# Patient Record
Sex: Female | Born: 1980 | Race: Black or African American | Hispanic: No | Marital: Married | Smoking: Current every day smoker
Health system: Southern US, Community
[De-identification: ages and names within clinical notes are randomized; demographics above are authoritative.]

## PROBLEM LIST (undated history)

## (undated) DIAGNOSIS — R87629 Unspecified abnormal cytological findings in specimens from vagina: Secondary | ICD-10-CM

## (undated) DIAGNOSIS — A599 Trichomoniasis, unspecified: Principal | ICD-10-CM

## (undated) HISTORY — PX: TUBAL LIGATION: SHX77

## (undated) HISTORY — DX: Unspecified abnormal cytological findings in specimens from vagina: R87.629

## (undated) HISTORY — DX: Trichomoniasis, unspecified: A59.9

---

## 2012-04-03 ENCOUNTER — Emergency Department (HOSPITAL_COMMUNITY): Payer: Managed Care, Other (non HMO)

## 2012-04-03 ENCOUNTER — Inpatient Hospital Stay (HOSPITAL_COMMUNITY)
Admission: EM | Admit: 2012-04-03 | Discharge: 2012-04-05 | DRG: 417 | Disposition: A | Discharge status: Home / Self Care | Payer: Managed Care, Other (non HMO) | Attending: General Surgery | Admitting: General Surgery

## 2012-04-03 ENCOUNTER — Encounter (HOSPITAL_COMMUNITY): Payer: Self-pay | Admitting: *Deleted

## 2012-04-03 DIAGNOSIS — K859 Acute pancreatitis without necrosis or infection, unspecified: Secondary | ICD-10-CM | POA: Diagnosis present

## 2012-04-03 DIAGNOSIS — Z87891 Personal history of nicotine dependence: Secondary | ICD-10-CM

## 2012-04-03 DIAGNOSIS — Z6838 Body mass index (BMI) 38.0-38.9, adult: Secondary | ICD-10-CM

## 2012-04-03 DIAGNOSIS — E669 Obesity, unspecified: Secondary | ICD-10-CM | POA: Diagnosis present

## 2012-04-03 DIAGNOSIS — K819 Cholecystitis, unspecified: Secondary | ICD-10-CM

## 2012-04-03 DIAGNOSIS — K8010 Calculus of gallbladder with chronic cholecystitis without obstruction: Principal | ICD-10-CM | POA: Diagnosis present

## 2012-04-03 LAB — COMPREHENSIVE METABOLIC PANEL
ALT: 16 U/L (ref 0–35)
AST: 14 U/L (ref 0–37)
Alkaline Phosphatase: 76 U/L (ref 39–117)
CO2: 26 mEq/L (ref 19–32)
Calcium: 9.5 mg/dL (ref 8.4–10.5)
GFR calc Af Amer: >90 mL/min (ref >90)
GFR calc non Af Amer: >90 mL/min (ref >90)
Glucose, Bld: 100 mg/dL — ABNORMAL HIGH (ref 70–99)
Potassium: 3.7 mEq/L (ref 3.5–5.1)
Sodium: 138 mEq/L (ref 135–145)
Total Protein: 6.8 g/dL (ref 6.0–8.3)

## 2012-04-03 LAB — CBC WITH DIFFERENTIAL/PLATELET
Basophils Absolute: 0 10*3/uL (ref 0.0–0.1)
Eosinophils Relative: 3 % (ref 0–5)
Lymphocytes Relative: 36 % (ref 12–46)
Lymphs Abs: 3.4 10*3/uL (ref 0.7–4.0)
Neutrophils Relative %: 54 % (ref 43–77)
Platelets: 313 10*3/uL (ref 150–400)
RBC: 4.34 MIL/uL (ref 3.87–5.11)
RDW: 15.2 % (ref 11.5–15.5)
WBC: 9.6 10*3/uL (ref 4.0–10.5)

## 2012-04-03 LAB — URINALYSIS, ROUTINE W REFLEX MICROSCOPIC
Leukocytes, UA: NEGATIVE
Nitrite: NEGATIVE
Specific Gravity, Urine: >1.03 — ABNORMAL HIGH (ref 1.005–1.030)
Urobilinogen, UA: 0.2 mg/dL (ref 0.0–1.0)
pH: 6 (ref 5.0–8.0)

## 2012-04-03 LAB — PREGNANCY, URINE: Preg Test, Ur: NEGATIVE

## 2012-04-03 MED ORDER — HYDROMORPHONE HCL PF 1 MG/ML IJ SOLN
1.0000 mg | Freq: Once | INTRAMUSCULAR | Status: AC
  Administered 2012-04-03: 1 mg via INTRAVENOUS
  Filled 2012-04-03: qty 1

## 2012-04-03 MED ORDER — ONDANSETRON HCL 4 MG/2ML IJ SOLN
4.0000 mg | Freq: Once | INTRAMUSCULAR | Status: AC
  Administered 2012-04-03: 4 mg via INTRAVENOUS
  Filled 2012-04-03: qty 2

## 2012-04-03 MED ORDER — KCL-LACTATED RINGERS-D5W 20 MEQ/L IV SOLN
INTRAVENOUS | Status: DC
  Administered 2012-04-03 – 2012-04-04 (×2): via INTRAVENOUS
  Filled 2012-04-03 (×3): qty 1000

## 2012-04-03 MED ORDER — IOHEXOL 300 MG/ML  SOLN: 40.0000 mL | Freq: Once | INTRAMUSCULAR | Status: AC | PRN

## 2012-04-03 MED ORDER — ACETAMINOPHEN 650 MG RE SUPP: 650.0000 mg | Freq: Four times a day (QID) | RECTAL | Status: DC | PRN

## 2012-04-03 MED ORDER — CHLORHEXIDINE GLUCONATE 4 % EX LIQD
1.0000 "application " | Freq: Once | CUTANEOUS | Status: AC
  Administered 2012-04-03: 1 via TOPICAL
  Filled 2012-04-03: qty 15

## 2012-04-03 MED ORDER — DIPHENHYDRAMINE HCL 12.5 MG/5ML PO ELIX
12.5000 mg | ORAL_SOLUTION | Freq: Four times a day (QID) | ORAL | Status: DC | PRN
  Filled 2012-04-03: qty 10

## 2012-04-03 MED ORDER — ACETAMINOPHEN 325 MG PO TABS: 650.0000 mg | ORAL_TABLET | Freq: Four times a day (QID) | ORAL | Status: DC | PRN

## 2012-04-03 MED ORDER — PANTOPRAZOLE SODIUM 40 MG PO TBEC: 40.0000 mg | DELAYED_RELEASE_TABLET | Freq: Every day | ORAL | Status: DC

## 2012-04-03 MED ORDER — CEFAZOLIN SODIUM-DEXTROSE 2-3 GM-% IV SOLR
2.0000 g | INTRAVENOUS | Status: AC
  Administered 2012-04-04: 2 g via INTRAVENOUS
  Filled 2012-04-03: qty 50

## 2012-04-03 MED ORDER — ENOXAPARIN SODIUM 40 MG/0.4ML ~~LOC~~ SOLN
40.0000 mg | SUBCUTANEOUS | Status: DC
  Administered 2012-04-03 – 2012-04-04 (×2): 40 mg via SUBCUTANEOUS
  Filled 2012-04-03 (×2): qty 0.4

## 2012-04-03 MED ORDER — HYDROMORPHONE HCL PF 1 MG/ML IJ SOLN
1.0000 mg | INTRAMUSCULAR | Status: DC | PRN
  Administered 2012-04-04 (×2): 1 mg via INTRAVENOUS
  Filled 2012-04-03 (×2): qty 1

## 2012-04-03 MED ORDER — HYDROCODONE-ACETAMINOPHEN 5-325 MG PO TABS
1.0000 | ORAL_TABLET | ORAL | Status: DC | PRN
  Administered 2012-04-04: 1 via ORAL
  Administered 2012-04-04: 2 via ORAL
  Filled 2012-04-03: qty 2
  Filled 2012-04-03: qty 1

## 2012-04-03 MED ORDER — SODIUM CHLORIDE 0.9 % IV SOLN
INTRAVENOUS | Status: DC
  Administered 2012-04-03: 1000 mL via INTRAVENOUS

## 2012-04-03 MED ORDER — KCL-LACTATED RINGERS-D5W 20 MEQ/L IV SOLN
INTRAVENOUS | Status: AC
  Filled 2012-04-03: qty 2000

## 2012-04-03 MED ORDER — SODIUM CHLORIDE 0.9 % IV BOLUS (SEPSIS)
1000.0000 mL | Freq: Once | INTRAVENOUS | Status: AC
Start: 2012-04-03 — End: 2012-04-03
  Administered 2012-04-03: 1000 mL via INTRAVENOUS

## 2012-04-03 MED ORDER — DIPHENHYDRAMINE HCL 50 MG/ML IJ SOLN
12.5000 mg | Freq: Four times a day (QID) | INTRAMUSCULAR | Status: DC | PRN
  Filled 2012-04-03: qty 1

## 2012-04-03 MED ORDER — ONDANSETRON HCL 4 MG/2ML IJ SOLN: 4.0000 mg | Freq: Four times a day (QID) | INTRAMUSCULAR | Status: DC | PRN

## 2012-04-03 MED ORDER — NICOTINE 21 MG/24HR TD PT24
21.0000 mg | MEDICATED_PATCH | Freq: Every day | TRANSDERMAL | Status: DC
  Administered 2012-04-03 – 2012-04-05 (×3): 21 mg via TRANSDERMAL
  Filled 2012-04-03 (×3): qty 1

## 2012-04-03 MED ORDER — IOHEXOL 300 MG/ML  SOLN
100.0000 mL | Freq: Once | INTRAMUSCULAR | Status: AC | PRN
  Administered 2012-04-03: 100 mL via INTRAVENOUS

## 2012-04-03 NOTE — H&P (Signed)
Jocelyn Erickson is an 31 y.o. female.   Chief Complaint: Upper abdominal pain HPI: Patient is a 31 year old black female who presents with right upper quadrant and epigastric pain over the past month. Today, and worsened after eating. She did have some nausea and vomiting. She denies any fever, chills, or jaundice. She presented emergency room and was found to have an elevated lipase. Her liver enzyme tests were within normal limits. CT scan the abdomen and pelvis reveals cholecystitis with cholelithiasis and a mildly dilated hepatobiliary tree, though no choledocholithiasis was seen.  History reviewed. No pertinent past medical history.  Past Surgical History  Procedure Date  . Cesarean section     No family history on file. Social History:  reports that she has quit smoking. She does not have any smokeless tobacco history on file. She reports that she does not drink alcohol or use illicit drugs.  Allergies: No Known Allergies   (Not in a hospital admission)  Results for orders placed during the hospital encounter of 04/03/12 (from the past 48 hour(s))  URINALYSIS, ROUTINE W REFLEX MICROSCOPIC     Status: Abnormal   Collection Time   04/03/12  8:28 AM      Component Value Range Comment   Color, Urine YELLOW  YELLOW    APPearance HAZY (*) CLEAR    Specific Gravity, Urine >1.030 (*) 1.005 - 1.030    pH 6.0  5.0 - 8.0    Glucose, UA NEGATIVE  NEGATIVE mg/dL    Hgb urine dipstick NEGATIVE  NEGATIVE    Bilirubin Urine NEGATIVE  NEGATIVE    Ketones, ur NEGATIVE  NEGATIVE mg/dL    Protein, ur NEGATIVE  NEGATIVE mg/dL    Urobilinogen, UA 0.2  0.0 - 1.0 mg/dL    Nitrite NEGATIVE  NEGATIVE    Leukocytes, UA NEGATIVE  NEGATIVE MICROSCOPIC NOT DONE ON URINES WITH NEGATIVE PROTEIN, BLOOD, LEUKOCYTES, NITRITE, OR GLUCOSE <1000 mg/dL.  PREGNANCY, URINE     Status: Normal   Collection Time   04/03/12  8:28 AM      Component Value Range Comment   Preg Test, Ur NEGATIVE  NEGATIVE   CBC WITH  DIFFERENTIAL     Status: Abnormal   Collection Time   04/03/12  9:02 AM      Component Value Range Comment   WBC 9.6  4.0 - 10.5 K/uL    RBC 4.34  3.87 - 5.11 MIL/uL    Hemoglobin 10.9 (*) 12.0 - 15.0 g/dL    HCT 85.0 (*) 27.7 - 46.0 %    MCV 80.9  78.0 - 100.0 fL    MCH 25.1 (*) 26.0 - 34.0 pg    MCHC 31.1  30.0 - 36.0 g/dL    RDW 41.2  87.8 - 67.6 %    Platelets 313  150 - 400 K/uL    Neutrophils Relative 54  43 - 77 %    Neutro Abs 5.2  1.7 - 7.7 K/uL    Lymphocytes Relative 36  12 - 46 %    Lymphs Abs 3.4  0.7 - 4.0 K/uL    Monocytes Relative 8  3 - 12 %    Monocytes Absolute 0.8  0.1 - 1.0 K/uL    Eosinophils Relative 3  0 - 5 %    Eosinophils Absolute 0.3  0.0 - 0.7 K/uL    Basophils Relative 0  0 - 1 %    Basophils Absolute 0.0  0.0 - 0.1 K/uL  COMPREHENSIVE METABOLIC PANEL     Status: Abnormal   Collection Time   04/03/12  9:02 AM      Component Value Range Comment   Sodium 138  135 - 145 mEq/L    Potassium 3.7  3.5 - 5.1 mEq/L    Chloride 104  96 - 112 mEq/L    CO2 26  19 - 32 mEq/L    Glucose, Bld 100 (*) 70 - 99 mg/dL    BUN 12  6 - 23 mg/dL    Creatinine, Ser 1.61  0.50 - 1.10 mg/dL    Calcium 9.5  8.4 - 09.6 mg/dL    Total Protein 6.8  6.0 - 8.3 g/dL    Albumin 3.6  3.5 - 5.2 g/dL    AST 14  0 - 37 U/L    ALT 16  0 - 35 U/L    Alkaline Phosphatase 76  39 - 117 U/L    Total Bilirubin 0.1 (*) 0.3 - 1.2 mg/dL REPEATED TO VERIFY   GFR calc non Af Amer >90  >90 mL/min    GFR calc Af Amer >90  >90 mL/min   LIPASE, BLOOD     Status: Abnormal   Collection Time   04/03/12  9:02 AM      Component Value Range Comment   Lipase 123 (*) 11 - 59 U/L    Ct Abdomen Pelvis W Contrast  04/03/2012  *RADIOLOGY REPORT*  Clinical Data: Right upper quadrant abdominal pain, evaluate for cholelithiasis  CT ABDOMEN AND PELVIS WITH CONTRAST  Technique:  Multidetector CT imaging of the abdomen and pelvis was performed following the standard protocol during bolus administration of  intravenous contrast.  Contrast: OMNIPAQUE IOHEXOL 300 MG/ML  SOLN  Comparison: None.  Findings:  Normal hepatic contour.  There are multiple hyperattenuating stones within a mildly distended gallbladder.  There is mild apparent thickening of the gallbladder wall with possible minimal amount of pericholecystic fluid.  No definite stones are seen within the cystic or common bile duct, though note, evaluation is somewhat degraded secondary to patient body habitus and underpenetration. There is apparent mild intrahepatic biliary ductal dilatation.  No discrete hepatic lesions.  No ascites.  There is symmetric enhancement the bilateral kidneys.  No renal stones.  No urinary obstruction.  No discrete renal lesions.  No perinephric stranding.  Normal appearance of the bilateral adrenal glands, pancreas and spleen.  Ingested enteric contrast extends to the level of the ascending colon.  No evidence of enteric obstruction.  Bowel is normal in course and caliber without wall thickening.  Normal appearance of the appendix.  No pneumoperitoneum, pneumatosis or portal venous gas.  Normal caliber of the abdominal aorta.  The major branch vessels of the abdominal aorta are patent.  No retroperitoneal, mesenteric, pelvic or inguinal lymphadenopathy.  Pelvic organs are normal for age.  No free fluid in the pelvis.  Limited visualization of the lower thorax demonstrates minimal subpleural right heterogeneous opacities favored to represent atelectasis.  Normal heart size.  No pericardial effusion.  No acute or aggressive osseous abnormalities.  IMPRESSION: Overall findings worrisome for acute cholecystitis with multiple gallstones within a dilated and minimally thick walled gallbladder. While no discrete stones are seen within the cystic or common bile duct, there is mild intrahepatic biliary ductal dilatation. Further evaluation could be performed with a HIDA scan as clinically indicated.  Original Report Authenticated By:  Waynard Reeds, M.D.    Review of Systems  Constitutional: Positive  for malaise/fatigue.  HENT: Negative.   Eyes: Negative.   Respiratory: Negative.   Cardiovascular: Negative.   Gastrointestinal: Positive for heartburn, nausea, vomiting and abdominal pain.  Genitourinary: Negative.   Musculoskeletal: Negative.   Skin: Negative.   Neurological: Negative.   Endo/Heme/Allergies: Negative.   Psychiatric/Behavioral: Negative.     Blood pressure 122/61, pulse 82, temperature 97.9 F (36.6 C), resp. rate 16, height 5\' 3"  (1.6 m), weight 99.791 kg (220 lb), last menstrual period 03/27/2012, SpO2 98.00%. Physical Exam  Constitutional: She is oriented to person, place, and time. She appears well-developed and well-nourished.       Obese  HENT:  Head: Normocephalic and atraumatic.  Eyes: No scleral icterus.  Neck: Normal range of motion. Neck supple.  Cardiovascular: Normal rate, regular rhythm and normal heart sounds.   Respiratory: Effort normal and breath sounds normal.  GI: Soft. Bowel sounds are normal. There is tenderness. There is guarding.       Tenderness in right upper quadrant to palpation.  Neurological: She is alert and oriented to person, place, and time.  Skin: Skin is warm and dry.  Psychiatric: She has a normal mood and affect. Her behavior is normal. Judgment and thought content normal.     Assessment/Plan Impression: Cholecystitis, cholelithiasis, gallstone pancreatitis Plan: The patient be taken to the operating room for laparoscopic cholecystectomy with cholangiograms once her pain and nausea have resolved. A repeat lipase has been ordered. The risks and benefits of the procedure including bleeding, infection, hepatobiliary injury, the possibly of an open procedure were fully explained to the patient, gave informed consent.  Coleta Grosshans A 04/03/2012, 2:57 PM

## 2012-04-03 NOTE — ED Provider Notes (Addendum)
History  This chart was scribed for Donnetta Hutching, MD by Bennett Scrape. This patient was seen in room [facility] and the patient's care was started at 8:28AM.  CSN: [id number]  Arrival date & time 04/03/12  0744   First MD Initiated Contact with Patient 04/03/12 [phone]      Chief Complaint  Patient presents with  . Abdominal Pain  . Flank Pain     The history is provided by the patient. No language interpreter was used.    Jocelyn Erickson is a 31 y.o. female who presents to the Emergency Department complaining of one month of gradual onset, gradually worsening, constant RUQ abdominal pain described as soreness that radiates to her LUQ and back. She denies having any modifying factors, specifically that the pain is worse with eating. She denies taking OTC medications at home to improve symptoms. She denies having prior episodes of similar symptoms. She denies fever, neck pain, sore throat, visual disturbance, CP, cough, SOB, nausea, emesis, diarrhea, urinary symptoms, back pain, HA, weakness, numbness and rash as associated symptoms. She denies possbility pregnancy due to prior tubal ligation surgery. She has a h/o spinal meningitis and is a former smoker but denies alcohol use.    History reviewed. No pertinent past medical history. Spinal meningitis-1999, per pt at bedside  Past Surgical History  Procedure Date  . Cesarean section   tubal ligation per pt at bedside  No family history on file.  History  Substance Use Topics  . Smoking status: Former Games developer  . Smokeless tobacco: Not on file  . Alcohol Use: No    No OB history provided.  Review of Systems  A complete 10 system review of systems was obtained and all systems are negative except as noted in the HPI and PMH.   Allergies  Review of patient's allergies indicates no known allergies.  Home Medications  No current outpatient prescriptions on file.  Triage Vitals: BP 125/84  Pulse 75  Temp 97.9 F (36.6 C)   Resp 20  Ht 5\' 3"  (1.6 m)  Wt 220 lb (99.791 kg)  BMI 38.97 kg/m2  SpO2 100%  LMP 03/27/2012  Physical Exam  Nursing note and vitals reviewed. Constitutional: She is oriented to person, place, and time. She appears well-developed and well-nourished. No distress.  HENT:  Head: Normocephalic and atraumatic.  Eyes: EOM are normal.  Neck: Neck supple. No tracheal deviation present.  Cardiovascular: Normal rate.   Pulmonary/Chest: Effort normal. No respiratory distress.  Abdominal: There is tenderness (minimal RUQ tenderness).  Musculoskeletal: Normal range of motion.  Neurological: She is alert and oriented to person, place, and time.  Skin: Skin is warm and dry.  Psychiatric: She has a normal mood and affect. Her behavior is normal.    ED Course  Procedures (including critical care time)  DIAGNOSTIC STUDIES: Oxygen Saturation is 100% on room air, normal by my interpretation.    COORDINATION OF CARE: 8:59AM-Discussed treatment plan which includes blood work with pt at bedside and pt agreed to plan. Advised pt that she will need to return tomorrow for an [place] to further investigate her symptoms and pt agreed. 9:57AM-Pt rechecked and is still feeling pain. Will order another 1 mg of Dilaudid. Advised pt of plan to get a CT of abdomen with pt and pt agreed.   Labs Reviewed  URINALYSIS, ROUTINE W REFLEX MICROSCOPIC - Abnormal; Notable for the following:    APPearance HAZY (*)     Specific Gravity, Urine >1.030 (*)  All other components within normal limits  CBC WITH DIFFERENTIAL - Abnormal; Notable for the following:    Hemoglobin 10.9 (*)     HCT 35.1 (*)     MCH 25.1 (*)     All other components within normal limits  COMPREHENSIVE METABOLIC PANEL - Abnormal; Notable for the following:    Glucose, Bld 100 (*)     Total Bilirubin 0.1 (*)  REPEATED TO VERIFY   All other components within normal limits  LIPASE, BLOOD - Abnormal; Notable for the following:    Lipase 123 (*)       All other components within normal limits  PREGNANCY, URINE   Ct Abdomen Pelvis W Contrast  04/03/2012  *RADIOLOGY REPORT*  Clinical Data: Right upper quadrant abdominal pain, evaluate for cholelithiasis  CT ABDOMEN AND PELVIS WITH CONTRAST  Technique:  Multidetector CT imaging of the abdomen and pelvis was performed following the standard protocol during bolus administration of intravenous contrast.  Contrast: OMNIPAQUE IOHEXOL 300 MG/ML  SOLN  Comparison: None.  Findings:  Normal hepatic contour.  There are multiple hyperattenuating stones within a mildly distended gallbladder.  There is mild apparent thickening of the gallbladder wall with possible minimal amount of pericholecystic fluid.  No definite stones are seen within the cystic or common bile duct, though note, evaluation is somewhat degraded secondary to patient body habitus and underpenetration. There is apparent mild intrahepatic biliary ductal dilatation.  No discrete hepatic lesions.  No ascites.  There is symmetric enhancement the bilateral kidneys.  No renal stones.  No urinary obstruction.  No discrete renal lesions.  No perinephric stranding.  Normal appearance of the bilateral adrenal glands, pancreas and spleen.  Ingested enteric contrast extends to the level of the ascending colon.  No evidence of enteric obstruction.  Bowel is normal in course and caliber without wall thickening.  Normal appearance of the appendix.  No pneumoperitoneum, pneumatosis or portal venous gas.  Normal caliber of the abdominal aorta.  The major branch vessels of the abdominal aorta are patent.  No retroperitoneal, mesenteric, pelvic or inguinal lymphadenopathy.  Pelvic organs are normal for age.  No free fluid in the pelvis.  Limited visualization of the lower thorax demonstrates minimal subpleural right heterogeneous opacities favored to represent atelectasis.  Normal heart size.  No pericardial effusion.  No acute or aggressive osseous abnormalities.   IMPRESSION: Overall findings worrisome for acute cholecystitis with multiple gallstones within a dilated and minimally thick walled gallbladder. While no discrete stones are seen within the cystic or common bile duct, there is mild intrahepatic biliary ductal dilatation. Further evaluation could be performed with a HIDA scan as clinically indicated.  Original Report Authenticated By: Waynard Reeds, M.D.     No diagnosis found.    MDM  History and physical consistent with acute cholecystitis.  CT scan confirms. Will hydrate, treat pain, consult general surgery.      I personally performed the services described in this documentation, which was scribed in my presence. The recorded information has been reviewed and considered.    Donnetta Hutching, MD 04/03/12 1412  Donnetta Hutching, MD 04/23/12 [phone]

## 2012-04-03 NOTE — ED Notes (Signed)
Pt c/o right mid abd pain, flank pain, pt states that she has had the pain for a month, pain radiates from the front to the back, has remained constant but is getting worse. Denies any uti symptoms, n/v

## 2012-04-04 ENCOUNTER — Inpatient Hospital Stay (HOSPITAL_COMMUNITY): Payer: Managed Care, Other (non HMO)

## 2012-04-04 ENCOUNTER — Encounter (HOSPITAL_COMMUNITY): Payer: Self-pay | Admitting: *Deleted

## 2012-04-04 ENCOUNTER — Encounter (HOSPITAL_COMMUNITY): Payer: Self-pay | Admitting: Anesthesiology

## 2012-04-04 ENCOUNTER — Inpatient Hospital Stay (HOSPITAL_COMMUNITY): Payer: Managed Care, Other (non HMO) | Admitting: Anesthesiology

## 2012-04-04 ENCOUNTER — Encounter (HOSPITAL_COMMUNITY)
Admission: EM | Disposition: A | Discharge status: Home / Self Care | Payer: Self-pay | Source: Home / Self Care | Attending: General Surgery

## 2012-04-04 HISTORY — PX: CHOLECYSTECTOMY: SHX55

## 2012-04-04 LAB — LIPASE, BLOOD: Lipase: 134 U/L — ABNORMAL HIGH (ref 11–59)

## 2012-04-04 LAB — HEPATIC FUNCTION PANEL
ALT: 112 U/L — ABNORMAL HIGH (ref 0–35)
AST: 68 U/L — ABNORMAL HIGH (ref 0–37)
Alkaline Phosphatase: 106 U/L (ref 39–117)
Bilirubin, Direct: <0.1 mg/dL (ref 0.0–0.3)
Total Protein: 5.9 g/dL — ABNORMAL LOW (ref 6.0–8.3)

## 2012-04-04 SURGERY — LAPAROSCOPIC CHOLECYSTECTOMY WITH INTRAOPERATIVE CHOLANGIOGRAM
Anesthesia: General | Site: Abdomen | Wound class: Contaminated

## 2012-04-04 MED ORDER — NEOSTIGMINE METHYLSULFATE 1 MG/ML IJ SOLN
INTRAMUSCULAR | Status: DC | PRN
  Administered 2012-04-04: 3 mg via INTRAVENOUS

## 2012-04-04 MED ORDER — DEXTROSE IN LACTATED RINGERS 5 % IV SOLN
INTRAVENOUS | Status: DC
  Filled 2012-04-04 (×4): qty 1000

## 2012-04-04 MED ORDER — FENTANYL CITRATE 0.05 MG/ML IJ SOLN
INTRAMUSCULAR | Status: AC
  Filled 2012-04-04: qty 2

## 2012-04-04 MED ORDER — BUPIVACAINE-EPINEPHRINE (PF) 0.5% -1:200000 IJ SOLN
INTRAMUSCULAR | Status: DC | PRN
  Administered 2012-04-04: 15 mL

## 2012-04-04 MED ORDER — NEOSTIGMINE METHYLSULFATE 1 MG/ML IJ SOLN
INTRAMUSCULAR | Status: AC
  Filled 2012-04-04: qty 10

## 2012-04-04 MED ORDER — ONDANSETRON HCL 4 MG/2ML IJ SOLN: 4.0000 mg | Freq: Once | INTRAMUSCULAR | Status: DC | PRN

## 2012-04-04 MED ORDER — ROCURONIUM BROMIDE 100 MG/10ML IV SOLN
INTRAVENOUS | Status: DC | PRN
  Administered 2012-04-04: 5 mg via INTRAVENOUS
  Administered 2012-04-04: 35 mg via INTRAVENOUS
  Administered 2012-04-04: 10 mg via INTRAVENOUS

## 2012-04-04 MED ORDER — GLYCOPYRROLATE 0.2 MG/ML IJ SOLN
INTRAMUSCULAR | Status: DC | PRN
  Administered 2012-04-04: .5 mg via INTRAVENOUS

## 2012-04-04 MED ORDER — CEFAZOLIN SODIUM-DEXTROSE 2-3 GM-% IV SOLR
INTRAVENOUS | Status: AC
  Filled 2012-04-04: qty 50

## 2012-04-04 MED ORDER — GLYCOPYRROLATE 0.2 MG/ML IJ SOLN
INTRAMUSCULAR | Status: AC
  Filled 2012-04-04: qty 1

## 2012-04-04 MED ORDER — IOHEXOL 350 MG/ML SOLN
INTRAVENOUS | Status: DC | PRN
  Administered 2012-04-04: 40 mL via INTRAVENOUS

## 2012-04-04 MED ORDER — BUPIVACAINE HCL (PF) 0.5 % IJ SOLN
INTRAMUSCULAR | Status: AC
  Filled 2012-04-04: qty 30

## 2012-04-04 MED ORDER — PROPOFOL 10 MG/ML IV EMUL
INTRAVENOUS | Status: AC
  Filled 2012-04-04: qty 20

## 2012-04-04 MED ORDER — PROPOFOL 10 MG/ML IV EMUL
INTRAVENOUS | Status: DC | PRN
  Administered 2012-04-04: 150 mg via INTRAVENOUS
  Administered 2012-04-04: 30 mg via INTRAVENOUS

## 2012-04-04 MED ORDER — MIDAZOLAM HCL 2 MG/2ML IJ SOLN
1.0000 mg | INTRAMUSCULAR | Status: DC | PRN
  Administered 2012-04-04: 2 mg via INTRAVENOUS

## 2012-04-04 MED ORDER — KETOROLAC TROMETHAMINE 30 MG/ML IJ SOLN
30.0000 mg | Freq: Once | INTRAMUSCULAR | Status: AC
  Administered 2012-04-04: 30 mg via INTRAVENOUS
  Filled 2012-04-04: qty 1

## 2012-04-04 MED ORDER — FENTANYL CITRATE 0.05 MG/ML IJ SOLN
25.0000 ug | INTRAMUSCULAR | Status: DC | PRN
  Administered 2012-04-04 (×2): 50 ug via INTRAVENOUS

## 2012-04-04 MED ORDER — LIDOCAINE HCL (CARDIAC) 10 MG/ML IV SOLN
INTRAVENOUS | Status: DC | PRN
  Administered 2012-04-04: 20 mg via INTRAVENOUS

## 2012-04-04 MED ORDER — ROCURONIUM BROMIDE 50 MG/5ML IV SOLN
INTRAVENOUS | Status: AC
  Filled 2012-04-04: qty 1

## 2012-04-04 MED ORDER — ONDANSETRON HCL 4 MG/2ML IJ SOLN
4.0000 mg | Freq: Once | INTRAMUSCULAR | Status: AC
  Administered 2012-04-04: 4 mg via INTRAVENOUS

## 2012-04-04 MED ORDER — HEMOSTATIC AGENTS (NO CHARGE) OPTIME
TOPICAL | Status: DC | PRN
  Administered 2012-04-04: 1 via TOPICAL

## 2012-04-04 MED ORDER — LIDOCAINE HCL (PF) 1 % IJ SOLN
INTRAMUSCULAR | Status: AC
  Filled 2012-04-04: qty 5

## 2012-04-04 MED ORDER — 0.9 % SODIUM CHLORIDE (POUR BTL) OPTIME
TOPICAL | Status: DC | PRN
  Administered 2012-04-04: 1000 mL

## 2012-04-04 MED ORDER — GLYCOPYRROLATE 0.2 MG/ML IJ SOLN
0.2000 mg | Freq: Once | INTRAMUSCULAR | Status: AC
  Administered 2012-04-04: 0.2 mg via INTRAVENOUS

## 2012-04-04 MED ORDER — LACTATED RINGERS IV SOLN
INTRAVENOUS | Status: DC
  Administered 2012-04-04 (×2): via INTRAVENOUS

## 2012-04-04 MED ORDER — FENTANYL CITRATE 0.05 MG/ML IJ SOLN
INTRAMUSCULAR | Status: AC
  Filled 2012-04-04: qty 5

## 2012-04-04 MED ORDER — ONDANSETRON HCL 4 MG/2ML IJ SOLN
INTRAMUSCULAR | Status: AC
  Filled 2012-04-04: qty 2

## 2012-04-04 MED ORDER — MIDAZOLAM HCL 2 MG/2ML IJ SOLN
INTRAMUSCULAR | Status: AC
  Filled 2012-04-04: qty 2

## 2012-04-04 MED ORDER — FENTANYL CITRATE 0.05 MG/ML IJ SOLN
INTRAMUSCULAR | Status: DC | PRN
  Administered 2012-04-04 (×2): 50 ug via INTRAVENOUS
  Administered 2012-04-04: 100 ug via INTRAVENOUS
  Administered 2012-04-04 (×2): 50 ug via INTRAVENOUS

## 2012-04-04 MED ORDER — GLYCOPYRROLATE 0.2 MG/ML IJ SOLN
INTRAMUSCULAR | Status: AC
  Filled 2012-04-04: qty 2

## 2012-04-04 SURGICAL SUPPLY — 40 items
APPLIER CLIP ROT 10 11.4 M/L (STAPLE) ×2
BAG HAMPER (MISCELLANEOUS) ×2 IMPLANT
CATH CHOLANGIOGRAM 4.5FR (CATHETERS) ×2 IMPLANT
CLIP APPLIE ROT 10 11.4 M/L (STAPLE) ×1 IMPLANT
CLOTH BEACON ORANGE TIMEOUT ST (SAFETY) ×2 IMPLANT
COVER LIGHT HANDLE STERIS (MISCELLANEOUS) ×4 IMPLANT
COVER MAYO STAND XLG (DRAPE) ×2 IMPLANT
CUTTER LINEAR ENDO 35 ETS TH (STAPLE) ×2 IMPLANT
DECANTER SPIKE VIAL GLASS SM (MISCELLANEOUS) ×2 IMPLANT
DISSECTOR BLUNT TIP ENDO 5MM (MISCELLANEOUS) IMPLANT
DRAPE C-ARM FOLDED MOBILE STRL (DRAPES) ×2 IMPLANT
DURAPREP 26ML APPLICATOR (WOUND CARE) ×2 IMPLANT
ELECT REM PT RETURN 9FT ADLT (ELECTROSURGICAL) ×2
ELECTRODE REM PT RTRN 9FT ADLT (ELECTROSURGICAL) ×1 IMPLANT
FILTER SMOKE EVAC LAPAROSHD (FILTER) ×2 IMPLANT
FORMALIN 10 PREFIL 120ML (MISCELLANEOUS) ×2 IMPLANT
GLOVE BIO SURGEON STRL SZ7.5 (GLOVE) ×2 IMPLANT
GLOVE BIOGEL PI IND STRL 7.0 (GLOVE) ×2 IMPLANT
GLOVE BIOGEL PI INDICATOR 7.0 (GLOVE) ×2
GLOVE ECLIPSE 6.5 STRL STRAW (GLOVE) ×6 IMPLANT
GOWN STRL REIN XL XLG (GOWN DISPOSABLE) ×6 IMPLANT
HEMOSTAT SNOW SURGICEL 2X4 (HEMOSTASIS) ×2 IMPLANT
INST SET LAPROSCOPIC AP (KITS) ×2 IMPLANT
KIT ROOM TURNOVER APOR (KITS) ×2 IMPLANT
KIT TROCAR LAP CHOLE (TROCAR) ×2 IMPLANT
MANIFOLD NEPTUNE II (INSTRUMENTS) ×2 IMPLANT
NS IRRIG 1000ML POUR BTL (IV SOLUTION) ×2 IMPLANT
PACK LAP CHOLE LZT030E (CUSTOM PROCEDURE TRAY) ×2 IMPLANT
PAD ARMBOARD 7.5X6 YLW CONV (MISCELLANEOUS) ×2 IMPLANT
POUCH SPECIMEN RETRIEVAL 10MM (ENDOMECHANICALS) ×2 IMPLANT
SET BASIN LINEN APH (SET/KITS/TRAYS/PACK) ×2 IMPLANT
SET TUBE IRRIG SUCTION NO TIP (IRRIGATION / IRRIGATOR) IMPLANT
SPONGE GAUZE 2X2 8PLY STRL LF (GAUZE/BANDAGES/DRESSINGS) ×8 IMPLANT
STAPLER VISISTAT (STAPLE) ×2 IMPLANT
SUT VICRYL 0 UR6 27IN ABS (SUTURE) ×4 IMPLANT
SYR 20CC LL (SYRINGE) ×2 IMPLANT
SYR 30ML LL (SYRINGE) ×2 IMPLANT
TAPE CLOTH SURG 4X10 WHT LF (GAUZE/BANDAGES/DRESSINGS) ×2 IMPLANT
WARMER LAPAROSCOPE (MISCELLANEOUS) ×2 IMPLANT
YANKAUER SUCT 12FT TUBE ARGYLE (SUCTIONS) ×2 IMPLANT

## 2012-04-04 NOTE — Anesthesia Procedure Notes (Signed)
Procedure Name: Intubation Date/Time: 04/04/2012 12:41 PM Performed by: Franco Nones Pre-anesthesia Checklist: Patient identified, Patient being monitored, Timeout performed, Emergency Drugs available and Suction available Patient Re-evaluated:Patient Re-evaluated prior to inductionOxygen Delivery Method: Circle System Utilized Preoxygenation: Pre-oxygenation with 100% oxygen Intubation Type: IV induction Ventilation: Mask ventilation without difficulty Laryngoscope Size: Miller and 2 Grade View: Grade I Tube type: Oral Tube size: 7.0 mm Number of attempts: 1 Airway Equipment and Method: stylet Placement Confirmation: ETT inserted through vocal cords under direct vision,  positive ETCO2 and breath sounds checked- equal and bilateral Secured at: 21 cm Tube secured with: Tape Dental Injury: Teeth and Oropharynx as per pre-operative assessment

## 2012-04-04 NOTE — Anesthesia Postprocedure Evaluation (Signed)
  Anesthesia Post-op Note  Patient: Jocelyn Erickson  Procedure(s) Performed: Procedure(s) (LRB): LAPAROSCOPIC CHOLECYSTECTOMY WITH INTRAOPERATIVE CHOLANGIOGRAM (N/A)  Patient Location: PACU  Anesthesia Type: General  Level of Consciousness: oriented and sedated  Airway and Oxygen Therapy: Patient Spontanous Breathing  Post-op Pain: none  Post-op Assessment: Post-op Vital signs reviewed, Patient's Cardiovascular Status Stable, Respiratory Function Stable, Patent Airway and No signs of Nausea or vomiting  Post-op Vital Signs: Reviewed and stable  Complications: No apparent anesthesia complications

## 2012-04-04 NOTE — Plan of Care (Signed)
Problem: Diagnosis - Type of Surgery Goal: General Surgical Patient Education (See Patient Education module for education specifics)  Outcome: Progressing Discussed with the patient some pre op and post op procedures with her surgery.

## 2012-04-04 NOTE — Anesthesia Preprocedure Evaluation (Signed)
Anesthesia Evaluation  Patient identified by MRN, date of birth, ID band Patient awake    Reviewed: Allergy & Precautions, H&P , NPO status , Patient's Chart, lab work & pertinent test results  History of Anesthesia Complications Negative for: history of anesthetic complications  Airway Mallampati: II      Dental  (+) Teeth Intact   Pulmonary neg pulmonary ROS, Current Smoker,  breath sounds clear to auscultation        Cardiovascular negative cardio ROS  Rhythm:Regular     Neuro/Psych    GI/Hepatic negative GI ROS,   Endo/Other    Renal/GU      Musculoskeletal   Abdominal (+) + obese,   Peds  Hematology   Anesthesia Other Findings   Reproductive/Obstetrics                           Anesthesia Physical Anesthesia Plan  ASA: I  Anesthesia Plan: General   Post-op Pain Management:    Induction: Intravenous  Airway Management Planned: Oral ETT  Additional Equipment:   Intra-op Plan:   Post-operative Plan: Extubation in OR  Informed Consent: I have reviewed the patients History and Physical, chart, labs and discussed the procedure including the risks, benefits and alternatives for the proposed anesthesia with the patient or authorized representative who has indicated his/her understanding and acceptance.     Plan Discussed with:   Anesthesia Plan Comments:         Anesthesia Quick Evaluation

## 2012-04-04 NOTE — Transfer of Care (Signed)
Immediate Anesthesia Transfer of Care Note  Patient: Jocelyn Erickson  Procedure(s) Performed: Procedure(s) (LRB): LAPAROSCOPIC CHOLECYSTECTOMY WITH INTRAOPERATIVE CHOLANGIOGRAM (N/A)  Patient Location: PACU  Anesthesia Type: General  Level of Consciousness: awake, alert  and oriented  Airway & Oxygen Therapy: Patient Spontanous Breathing and Patient connected to face mask oxygen  Post-op Assessment: Report given to PACU RN  Post vital signs: Reviewed and stable  Complications: No apparent anesthesia complications

## 2012-04-04 NOTE — Op Note (Signed)
Patient:  Jocelyn Erickson  DOB:  1980-08-27  MRN:  [id number]   Preop Diagnosis:  Cholecystitis, cholelithiasis, gallstone pancreatitis  Postop Diagnosis:  Same  Procedure:  Laparoscopic cholecystectomy with cholangiograms  Surgeon:  Franky Macho, M.D.  Anes:  General endotracheal  Indications:  Patient is a 31 year old black female who presents with cholecystitis, cholelithiasis, and pancreatitis. CT scan of the abdomen and pelvis revealed cholelithiasis with a thickened gallbladder wall and slightly dilated hepatobiliary tree. The risks and benefits of the procedure including bleeding, infection, hepatobiliary injury, and the possibly of an open procedure were fully explained to the patient, gave informed consent.  Procedure note:  Patient is placed the supine position. After induction of general endotracheal anesthesia, the abdomen was prepped and draped using the usual sterile technique with DuraPrep. Surgical site confirmation was performed.  A supraumbilical incision was made down to the fascia. A Veress needle was introduced into the abdominal cavity and confirmation of placement was done using the saline drop test. The abdomen was then insufflated to 16 mm mercury pressure. An 11 mm trocar was introduced into the abdominal cavity under direct visualization without difficulty. The patient was placed in reverse Trendelenburg position and an additional 11 mm trocar was placed the epigastric region and 5 mm trochars were placed in the right upper quadrant and right flank regions. Liver was inspected and noted to be within normal limits. The gallbladder was retracted superiorly and laterally. The dissection was begun around the infundibulum of the gallbladder. The cystic duct was first identified. Its juncture to the infundibulum was fully identified. A single lead occult was placed proximally on the cystic duct. An incision was made in the cystic duct and a cholangiocatheter was inserted.  Under digital fluoroscopy, cholangiogram was performed. The system was flushed, thus this helped facilitate removing any/the hepatobiliary tree. He was noted be slightly dilated, dye flowed into the duodenum without difficulty. The system was again flushed with normal saline and the cholangiocatheter removed. In vascular Endo GIA was placed across the cystic duct and fired. The cystic artery was likewise ligated and divided using clips. The gallbladder was then freed away from the gallbladder fossa using Bovie electrocautery. The gallbladder delivered through the epigastric trocar site using an Endo Catch bag. The gallbladder fossa was inspected and no abnormal bleeding or bile leakage was noted. Surgicel is placed the gallbladder fossa. All fluid and air were then evacuated from the abdominal cavity prior to removal of the trochars.  All wounds were gave normal saline. All wounds were injected with 0.5% Sensorcaine. The supraumbilical fascia as well as the epigastric fascia were reapproximated using 0 Vicryl interrupted sutures. All skin incisions were closed using staples. Betadine ointment and dressed a dressings were applied.  All tape and needle counts were correct the end of the procedure. Patient was extubated in the operating room went back to recovery room awake in stable condition.  Complications:  None  EBL:  Minimal  Specimen:  Gallbladder

## 2012-04-05 ENCOUNTER — Encounter (HOSPITAL_COMMUNITY): Payer: Self-pay | Admitting: General Surgery

## 2012-04-05 LAB — CBC
HCT: 32.2 % — ABNORMAL LOW (ref 36.0–46.0)
MCHC: 31.1 g/dL (ref 30.0–36.0)
Platelets: 244 10*3/uL (ref 150–400)
RDW: 15.1 % (ref 11.5–15.5)

## 2012-04-05 LAB — BASIC METABOLIC PANEL
BUN: 4 mg/dL — ABNORMAL LOW (ref 6–23)
GFR calc Af Amer: >90 mL/min (ref >90)
GFR calc non Af Amer: >90 mL/min (ref >90)
Potassium: 3.4 mEq/L — ABNORMAL LOW (ref 3.5–5.1)
Sodium: 140 mEq/L (ref 135–145)

## 2012-04-05 LAB — HEPATIC FUNCTION PANEL
AST: 193 U/L — ABNORMAL HIGH (ref 0–37)
Bilirubin, Direct: <0.1 mg/dL (ref 0.0–0.3)

## 2012-04-05 MED ORDER — HYDROCODONE-ACETAMINOPHEN 5-325 MG PO TABS: 1.0000 | ORAL_TABLET | ORAL | Status: AC | PRN

## 2012-04-05 NOTE — Progress Notes (Signed)
UR chart review completed.  

## 2012-04-05 NOTE — Discharge Summary (Signed)
Physician Discharge Summary  Patient ID: Jocelyn Erickson MRN: [id number] DOB/AGE: 04-26-1981 31 y.o.  Admit date: 04/03/2012 Discharge date: 04/05/2012  Admission Diagnoses: Cholecystitis, cholelithiasis, pancreatitis  Discharge Diagnoses: Same Active Problems:  * No active hospital problems. *    Discharged Condition: good  Hospital Course: Patient is a 31 year old black female presented emergency room with epigastric right upper quadrant abdominal pain. CT scan the abdomen revealed cholecystitis with cholelithiasis. This was similar to a previous workup done several months earlier at [facility]. At that time, she had an MRCP which was negative for choledocholithiasis. She was noted to have an elevated lipase at the time of admission. Surgery was consulted and she was admitted to the hospital. The following day, she underwent laparoscopic cholecystectomy with cholangiograms. No choledocholithiasis was seen. She tolerated the procedure well. Her diet was advanced at difficulty. The following day, her liver enzyme tests were noted to be elevated, but this was consistent with her cholangiogram. Total bilirubin was normal. Amylase was within normal limits. The patient is being discharged home on postoperative day one in good and improving condition.  Treatments: surgery: Laparoscopic cholecystectomy with intraoperative cholangiograms on 04/04/2012  Discharge Exam: Blood pressure 124/79, pulse 78, temperature 98.5 F (36.9 C), temperature source Oral, resp. rate 18, height 5\' 3"  (1.6 m), weight 99.791 kg (220 lb), last menstrual period 03/27/2012, SpO2 91.00%. General appearance: alert, cooperative and no distress Eyes: No scleral icterus Resp: clear to auscultation bilaterally Cardio: regular rate and rhythm, S1, S2 normal, no murmur, click, rub or gallop GI: Soft. No distention noted. Dressings dry and intact.  Disposition: Home  Medication List  As of 04/05/2012  8:43 AM   TAKE  these medications         HYDROcodone-acetaminophen 5-325 MG per tablet   Commonly known as: NORCO/VICODIN   Take 1-2 tablets by mouth every 4 (four) hours as needed.           Follow-up Information    Follow up with Dalia Heading, MD. Schedule an appointment as soon as possible for a visit on 04/12/2012.   Contact information:   [address] [place] [address] [phone]          Signed: Dalia Heading 04/05/2012, 8:43 AM

## 2012-04-05 NOTE — Progress Notes (Signed)
Pt with orders to be discharged home. Discharge instructions given, pt verbalized understanding. Pt in stable condition upon discharge. Pt left with spouse via private vehicle.

## 2012-04-05 NOTE — Addendum Note (Signed)
Addendum  created 04/05/12 0820 by Franco Nones, CRNA   Modules edited:Charges VN

## 2015-07-19 ENCOUNTER — Emergency Department (HOSPITAL_COMMUNITY)
Admission: EM | Admit: 2015-07-19 | Discharge: 2015-07-19 | Disposition: A | Discharge status: Home / Self Care | Payer: Medicaid Other | Attending: Emergency Medicine | Admitting: Emergency Medicine

## 2015-07-19 ENCOUNTER — Encounter (HOSPITAL_COMMUNITY): Payer: Self-pay

## 2015-07-19 DIAGNOSIS — H109 Unspecified conjunctivitis: Secondary | ICD-10-CM | POA: Diagnosis not present

## 2015-07-19 DIAGNOSIS — J029 Acute pharyngitis, unspecified: Secondary | ICD-10-CM | POA: Insufficient documentation

## 2015-07-19 DIAGNOSIS — H578 Other specified disorders of eye and adnexa: Secondary | ICD-10-CM | POA: Diagnosis present

## 2015-07-19 DIAGNOSIS — F17210 Nicotine dependence, cigarettes, uncomplicated: Secondary | ICD-10-CM | POA: Diagnosis not present

## 2015-07-19 MED ORDER — TOBRAMYCIN 0.3 % OP SOLN
1.0000 [drp] | Freq: Once | OPHTHALMIC | Status: AC
  Administered 2015-07-19: 1 [drp] via OPHTHALMIC
  Filled 2015-07-19: qty 5

## 2015-07-19 NOTE — ED Notes (Signed)
I had one eye that was red and draining and it went away, now it's in both eyes.

## 2015-07-19 NOTE — Discharge Instructions (Signed)

## 2015-07-20 NOTE — ED Provider Notes (Signed)
CSN: [id number]     Arrival date & time 07/19/15  1845 History   First MD Initiated Contact with Patient 07/19/15 1916     Chief Complaint  Patient presents with  . Conjunctivitis     (Consider location/radiation/quality/duration/timing/severity/associated sxs/prior Treatment) The history is provided by the patient.   Jocelyn Erickson is a 34 y.o. female presenting with a several day history of left eye irritation, redness, yellow drainage and crusting, now with spread of same symptoms to her right eye.  She denies vision changes or eye pain, but endorses scratchy irritated sensation with blinking.  She has small children who do not have similar symptoms, so is unclear where she may have contracted this infection. She has had no medicines for this condition.  She has used warm moist compresses to soothe and clean her eyes.  She does not wear contacts.     History reviewed. No pertinent past medical history. Past Surgical History  Procedure Laterality Date  . Cesarean section    . Cholecystectomy  04/04/2012    Procedure: LAPAROSCOPIC CHOLECYSTECTOMY WITH INTRAOPERATIVE CHOLANGIOGRAM;  Surgeon: Dalia HeadingMark A Jenkins, MD;  Location: [facility];  Service: General;  Laterality: N/A;   No family history on file. Social History  Substance Use Topics  . Smoking status: Current Every Day Smoker -- 0.50 packs/day for 17 years    Types: Cigarettes  . Smokeless tobacco: None  . Alcohol Use: No   OB History    No data available     Review of Systems  Constitutional: Negative for fever and chills.  HENT: Positive for sore throat. Negative for congestion, ear pain, rhinorrhea, sinus pressure, trouble swallowing and voice change.   Eyes: Positive for discharge and redness. Negative for visual disturbance.  Respiratory: Negative for cough, shortness of breath, wheezing and stridor.   Cardiovascular: Negative for chest pain.  Gastrointestinal: Negative for abdominal pain.  Genitourinary: Negative.        Allergies  Review of patient's allergies indicates no known allergies.  Home Medications   Prior to Admission medications   Not on File   BP 103/75 mmHg  Pulse 90  Temp(Src) 98.3 F (36.8 C) (Oral)  Resp 14  Ht 5\' 3"  (1.6 m)  Wt 99.791 kg  BMI 38.98 kg/m2  SpO2 100%  LMP 07/14/2015 (Approximate) Physical Exam  Constitutional: She is oriented to person, place, and time. She appears well-developed and well-nourished.  HENT:  Head: Normocephalic and atraumatic.  Right Ear: Tympanic membrane and ear canal normal.  Left Ear: Tympanic membrane and ear canal normal.  Nose: No mucosal edema or rhinorrhea.  Mouth/Throat: Uvula is midline, oropharynx is clear and moist and mucous membranes are normal. No oropharyngeal exudate, posterior oropharyngeal edema, posterior oropharyngeal erythema or tonsillar abscesses.  Scaling, irritated appearing skin right ear pinna.  Eyes: EOM are normal. Pupils are equal, round, and reactive to light. Lids are everted and swept, no foreign bodies found. Right eye exhibits discharge. No foreign body present in the right eye. Left eye exhibits discharge. No foreign body present in the left eye. Right conjunctiva is injected. Left conjunctiva is injected.  Cardiovascular: Normal rate.   Pulmonary/Chest: Effort normal. No respiratory distress.  Musculoskeletal: Normal range of motion.  Neurological: She is alert and oriented to person, place, and time.  Skin: Skin is warm and dry.  Psychiatric: She has a normal mood and affect.    ED Course  Procedures (including critical care time) Labs Review Labs Reviewed - No data  to display  Imaging Review No results found. I have personally reviewed and evaluated these images and lab results as part of my medical decision-making.   EKG Interpretation None      MDM   Final diagnoses:  Bilateral conjunctivitis    Home care instructions given, tobrex gtts given. F/u here for any worsened  sx.  The patient appears reasonably screened and/or stabilized for discharge and I doubt any other medical condition or other [facility] requiring further screening, evaluation, or treatment in the ED at this time prior to discharge.     Burgess Amor, PA-C 07/20/15 1321  Glynn Octave, MD 07/20/15 [phone]

## 2016-02-02 ENCOUNTER — Emergency Department (HOSPITAL_COMMUNITY)
Admission: EM | Admit: 2016-02-02 | Discharge: 2016-02-02 | Disposition: A | Discharge status: Home / Self Care | Payer: Medicaid Other | Attending: Emergency Medicine | Admitting: Emergency Medicine

## 2016-02-02 ENCOUNTER — Emergency Department (HOSPITAL_COMMUNITY): Payer: Medicaid Other

## 2016-02-02 ENCOUNTER — Encounter (HOSPITAL_COMMUNITY): Payer: Self-pay

## 2016-02-02 DIAGNOSIS — Z79899 Other long term (current) drug therapy: Secondary | ICD-10-CM | POA: Diagnosis not present

## 2016-02-02 DIAGNOSIS — M546 Pain in thoracic spine: Secondary | ICD-10-CM | POA: Diagnosis present

## 2016-02-02 DIAGNOSIS — M792 Neuralgia and neuritis, unspecified: Secondary | ICD-10-CM | POA: Diagnosis not present

## 2016-02-02 DIAGNOSIS — F17210 Nicotine dependence, cigarettes, uncomplicated: Secondary | ICD-10-CM | POA: Diagnosis not present

## 2016-02-02 LAB — URINALYSIS, ROUTINE W REFLEX MICROSCOPIC
BILIRUBIN URINE: NEGATIVE
GLUCOSE, UA: NEGATIVE mg/dL
Hgb urine dipstick: NEGATIVE
Ketones, ur: NEGATIVE mg/dL
Leukocytes, UA: NEGATIVE
NITRITE: NEGATIVE
PH: 7 (ref 5.0–8.0)
Protein, ur: NEGATIVE mg/dL

## 2016-02-02 LAB — PREGNANCY, URINE: Preg Test, Ur: NEGATIVE

## 2016-02-02 MED ORDER — NAPROXEN 500 MG PO TABS: 500.0000 mg | ORAL_TABLET | Freq: Two times a day (BID) | ORAL | Status: DC

## 2016-02-02 NOTE — Discharge Instructions (Signed)
Follow up with dr. Romeo AppleHarrison if nor improving in 1-2 weeks

## 2016-02-02 NOTE — ED Notes (Signed)
Pt reports woke up with pain between shoulder blades last week.  Pt says hurts to hold her head back, says has a pulling sensation in neck.  Yesterday, started having numbness in r forearm and hand.  Denies injury but movement makes the pain worse.

## 2016-02-02 NOTE — ED Provider Notes (Signed)
CSN: [id number]     Arrival date & time 02/02/16  1454 History   First MD Initiated Contact with Patient 02/02/16 1528     Chief Complaint  Patient presents with  . Back Pain     (Consider location/radiation/quality/duration/timing/severity/associated sxs/prior Treatment) Patient is a 35 y.o. female presenting with back pain. The history is provided by the patient (pt complains of pain and numbness to right arm.).  Back Pain Pain location: A new upper back. Quality:  Aching Pain severity:  Mild Pain is:  Same all the time Onset quality:  Sudden Chronicity:  New Context: not emotional stress   Associated symptoms: no abdominal pain, no chest pain and no headaches     History reviewed. No pertinent past medical history. Past Surgical History  Procedure Laterality Date  . Cesarean section    . Cholecystectomy  04/04/2012    Procedure: LAPAROSCOPIC CHOLECYSTECTOMY WITH INTRAOPERATIVE CHOLANGIOGRAM;  Surgeon: Dalia Heading, MD;  Location: [facility];  Service: General;  Laterality: N/A;   No family history on file. Social History  Substance Use Topics  . Smoking status: Current Every Day Smoker -- 0.50 packs/day for 17 years    Types: Cigarettes  . Smokeless tobacco: None  . Alcohol Use: No   OB History    No data available     Review of Systems  Constitutional: Negative for appetite change and fatigue.  HENT: Negative for congestion, ear discharge and sinus pressure.   Eyes: Negative for discharge.  Respiratory: Negative for cough.   Cardiovascular: Negative for chest pain.  Gastrointestinal: Negative for abdominal pain and diarrhea.  Genitourinary: Negative for frequency and hematuria.  Musculoskeletal: Positive for back pain.       Pain and numbness right arm and some discomfort thoracic spine  Skin: Negative for rash.  Neurological: Negative for seizures and headaches.  Psychiatric/Behavioral: Negative for hallucinations.      Allergies  Review of patient's  allergies indicates no known allergies.  Home Medications   Prior to Admission medications   Medication Sig Start Date End Date Taking? Authorizing Provider  naproxen sodium (ALEVE) 220 MG tablet Take 220 mg by mouth daily as needed (for pain).   Yes Historical Provider, MD  naproxen (NAPROSYN) 500 MG tablet Take 1 tablet (500 mg total) by mouth 2 (two) times daily. 02/02/16   Bethann Berkshire, MD   BP 122/76 mmHg  Pulse 80  Temp(Src) 98.3 F (36.8 C) (Oral)  Resp 18  Ht  (1.6 m)  Wt 230 lb (104.327 kg)  BMI 40.75 kg/m2  SpO2 100%  LMP 01/22/2016 Physical Exam  Constitutional: She is oriented to person, place, and time. She appears well-developed.  HENT:  Head: Normocephalic.  Eyes: Conjunctivae and EOM are normal. No scleral icterus.  Neck: Neck supple. No thyromegaly present.  Cardiovascular: Normal rate and regular rhythm.  Exam reveals no gallop and no friction rub.   No murmur heard. Pulmonary/Chest: No stridor. She has no wheezes. She has no rales. She exhibits no tenderness.  Abdominal: She exhibits no distension. There is no tenderness. There is no rebound.  Musculoskeletal: Normal range of motion. She exhibits no edema.  Mild tenderness over T2-3. Mild weakness in right arm  Lymphadenopathy:    She has no cervical adenopathy.  Neurological: She is oriented to person, place, and time. She has normal reflexes. A cranial nerve deficit is present. She exhibits normal muscle tone. Coordination normal.  Skin: No rash noted. No erythema.  Psychiatric: She has  a normal mood and affect. Her behavior is normal.    ED Course  Procedures (including critical care time) Labs Review Labs Reviewed  URINALYSIS, ROUTINE W REFLEX MICROSCOPIC (NOT AT [facility]) - Abnormal; Notable for the following:    Specific Gravity, Urine <1.005 (*)    All other components within normal limits  PREGNANCY, URINE    Imaging Review Dg Chest 2 View  02/02/2016  CLINICAL DATA:  Right upper back pain  for 2 weeks EXAM: CHEST  2 VIEW COMPARISON:  None. FINDINGS: Cardiomediastinal silhouette is unremarkable. No acute infiltrate or pleural effusion. No pulmonary edema. Bony thorax is unremarkable. IMPRESSION: No active cardiopulmonary disease. Electronically Signed   By: Natasha MeadLiviu  Pop M.D.   On: 02/02/2016 16:21   Dg Cervical Spine Complete  02/02/2016  CLINICAL DATA:  Right side neck pain for 2 weeks, right side upper back pain EXAM: CERVICAL SPINE - COMPLETE 4+ VIEW COMPARISON:  None. FINDINGS: Six views of the cervical spine submitted. No acute fracture or subluxation. Minimal disc space flattening with anterior spurring at C6-C7 level. No neural foramina narrowing noted on oblique views. No prevertebral soft tissue swelling. Cervical airway is patent. IMPRESSION: No acute fracture or subluxation. Minimal disc space flattening with anterior spurring at C6-C7 level. Electronically Signed   By: Natasha MeadLiviu  Pop M.D.   On: 02/02/2016 16:21   I have personally reviewed and evaluated these images and lab results as part of my medical decision-making.   EKG Interpretation None      MDM   Final diagnoses:  Neuritis   C-spine S x-ray negative. Patient will be treated with Naprosyn for neuritis and right arm and referred to orthopedics    Bethann BerkshireJoseph Lamonta Cypress, MD 02/02/16 1646

## 2017-03-09 ENCOUNTER — Other Ambulatory Visit: Payer: Self-pay | Admitting: Adult Health

## 2017-04-02 ENCOUNTER — Encounter: Payer: Self-pay | Admitting: Adult Health

## 2017-04-02 ENCOUNTER — Ambulatory Visit (INDEPENDENT_AMBULATORY_CARE_PROVIDER_SITE_OTHER): Payer: Medicaid Other | Admitting: Adult Health

## 2017-04-02 ENCOUNTER — Other Ambulatory Visit (HOSPITAL_COMMUNITY)
Admission: RE | Admit: 2017-04-02 | Discharge: 2017-04-02 | Disposition: A | Discharge status: Home / Self Care | Payer: Medicaid Other | Source: Ambulatory Visit | Attending: Adult Health | Admitting: Adult Health

## 2017-04-02 VITALS — BP 110/60 | HR 78 | Ht 64.0 in | Wt 221.0 lb

## 2017-04-02 DIAGNOSIS — Z01419 Encounter for gynecological examination (general) (routine) without abnormal findings: Secondary | ICD-10-CM | POA: Diagnosis not present

## 2017-04-02 DIAGNOSIS — N898 Other specified noninflammatory disorders of vagina: Secondary | ICD-10-CM

## 2017-04-02 MED ORDER — FLUCONAZOLE 150 MG PO TABS: ORAL_TABLET | ORAL | 1 refills | Status: DC

## 2017-04-02 NOTE — Addendum Note (Signed)
Addended by: Federico FlakeNES, Elim Economou A on: 04/02/2017 09:47 AM   Modules accepted: Orders

## 2017-04-02 NOTE — Patient Instructions (Addendum)
Physical in 1 year Pap in 3 if normal Mammogram at 38 Labs with PCP  

## 2017-04-02 NOTE — Progress Notes (Signed)
Patient ID: Jocelyn Erickson, female   DOB: Aug 15, 1981, 36 y.o.   MRN: [id number] History of Present Illness: Jocelyn Erickson is a 36 year old black female, married, G4P4 in for well woman gyn exam and pap.She is new to this practice.  Getting new PCP.   Current Medications, Allergies, Past Medical History, Past Surgical History, Family History and Social History were reviewed in [facility] electronic medical record.     Review of Systems:  Patient denies any headaches, hearing loss, fatigue, blurred vision, shortness of breath, chest pain, abdominal pain, problems with bowel movements, urination, or intercourse. No joint pain or mood swings. +Itching before period.   Physical Exam:BP 110/60 (BP Location: Left Arm, Patient Position: Sitting, Cuff Size: Small)   Pulse 78   Ht 5\' 4"  (1.626 m)   Wt 221 lb (100.2 kg)   LMP 03/13/2017   BMI 37.93 kg/m  General:  Well developed, well nourished, no acute distress Skin:  Warm and dry Neck:  Midline trachea, normal thyroid, good ROM, no lymphadenopathy Lungs; Clear to auscultation bilaterally Breast:  No dominant palpable mass, retraction, or nipple discharge Cardiovascular: Regular rate and rhythm Abdomen:  Soft, non tender, no hepatosplenomegaly Pelvic:  External genitalia is normal in appearance, no lesions.  The vagina is normal in appearance. Urethra has no lesions or masses. The cervix is bulbous. Pap with HPV performed. Uterus is felt to be normal size, shape, and contour.  No adnexal masses or tenderness noted.Bladder is non tender, no masses felt Extremities/musculoskeletal:  No swelling or varicosities noted, no clubbing or cyanosis Psych:  No mood changes, alert and cooperative,seems happy PHQ 9 score 9., denies being suicidal or homicidal, may be depressed at times, declines meds.   Impression: 1. Encounter for routine gynecological examination with Papanicolaou smear of cervix   2. Itching in the vaginal area       Plan: Rx  Diflucan 150 mg #6 take 1 monthly before period as needed with 1 refill Physical in 1 year Pap in 3 if normal Mammogram at 38 Labs with PCP

## 2017-04-06 ENCOUNTER — Telehealth: Payer: Self-pay | Admitting: Adult Health

## 2017-04-06 ENCOUNTER — Encounter: Payer: Self-pay | Admitting: Adult Health

## 2017-04-06 DIAGNOSIS — A599 Trichomoniasis, unspecified: Secondary | ICD-10-CM | POA: Insufficient documentation

## 2017-04-06 HISTORY — DX: Trichomoniasis, unspecified: A59.9

## 2017-04-06 LAB — CYTOLOGY - PAP
ADEQUACY: ABSENT
DIAGNOSIS: NEGATIVE
HPV (WINDOPATH): NOT DETECTED

## 2017-04-06 MED ORDER — METRONIDAZOLE 500 MG PO TABS: 500.0000 mg | ORAL_TABLET | Freq: Three times a day (TID) | ORAL | 0 refills | Status: DC

## 2017-04-06 NOTE — Telephone Encounter (Signed)
Pt aware pap negative for malignancy and HPV but +trich, will rx flagyl, no sex for POC 8/24 at 12:15 pm, tell partner to get treated.

## 2017-04-16 ENCOUNTER — Ambulatory Visit: Payer: Medicaid Other | Admitting: Adult Health

## 2017-10-27 ENCOUNTER — Ambulatory Visit (INDEPENDENT_AMBULATORY_CARE_PROVIDER_SITE_OTHER): Payer: Medicaid Other | Admitting: Family Medicine

## 2017-10-27 ENCOUNTER — Other Ambulatory Visit: Payer: Self-pay

## 2017-10-27 ENCOUNTER — Encounter: Payer: Self-pay | Admitting: Family Medicine

## 2017-10-27 ENCOUNTER — Other Ambulatory Visit (HOSPITAL_COMMUNITY)
Admission: RE | Admit: 2017-10-27 | Discharge: 2017-10-27 | Disposition: A | Discharge status: Home / Self Care | Payer: Medicaid Other | Source: Ambulatory Visit | Attending: Family Medicine | Admitting: Family Medicine

## 2017-10-27 VITALS — BP 118/70 | HR 75 | Temp 98.0°F | Resp 16 | Ht 64.0 in | Wt 224.8 lb

## 2017-10-27 DIAGNOSIS — R5383 Other fatigue: Secondary | ICD-10-CM | POA: Diagnosis present

## 2017-10-27 DIAGNOSIS — F1290 Cannabis use, unspecified, uncomplicated: Secondary | ICD-10-CM | POA: Insufficient documentation

## 2017-10-27 DIAGNOSIS — F321 Major depressive disorder, single episode, moderate: Secondary | ICD-10-CM

## 2017-10-27 DIAGNOSIS — Z113 Encounter for screening for infections with a predominantly sexual mode of transmission: Secondary | ICD-10-CM | POA: Insufficient documentation

## 2017-10-27 DIAGNOSIS — R739 Hyperglycemia, unspecified: Secondary | ICD-10-CM | POA: Insufficient documentation

## 2017-10-27 DIAGNOSIS — Z720 Tobacco use: Secondary | ICD-10-CM

## 2017-10-27 DIAGNOSIS — Z23 Encounter for immunization: Secondary | ICD-10-CM | POA: Insufficient documentation

## 2017-10-27 MED ORDER — BUPROPION HCL ER (XL) 150 MG PO TB24: 150.0000 mg | ORAL_TABLET | Freq: Every day | ORAL | 0 refills | Status: DC

## 2017-10-27 NOTE — Patient Instructions (Addendum)
MARCH 18 is your QUIT DATE!!!!!!!!     Steps to Quit Smoking Smoking tobacco can be bad for your health. It can also affect almost every organ in your body. Smoking puts you and people around you at risk for many serious long-lasting (chronic) diseases. Quitting smoking is hard, but it is one of the best things that you can do for your health. It is never too late to quit. What are the benefits of quitting smoking? When you quit smoking, you lower your risk for getting serious diseases and conditions. They can include:  Lung cancer or lung disease.  Heart disease.  Stroke.  Heart attack.  Not being able to have children (infertility).  Weak bones (osteoporosis) and broken bones (fractures).  If you have coughing, wheezing, and shortness of breath, those symptoms may get better when you quit. You may also get sick less often. If you are pregnant, quitting smoking can help to lower your chances of having a baby of low birth weight. What can I do to help me quit smoking? Talk with your doctor about what can help you quit smoking. Some things you can do (strategies) include:  Quitting smoking totally, instead of slowly cutting back how much you smoke over a period of time.  Going to in-person counseling. You are more likely to quit if you go to many counseling sessions.  Using resources and support systems, such as: ? Agricultural engineernline chats with a Veterinary surgeoncounselor. ? Phone quitlines. ? Automotive engineerrinted self-help materials. ? Support groups or group counseling. ? Text messaging programs. ? Mobile phone apps or applications.  Taking medicines. Some of these medicines may have nicotine in them. If you are pregnant or breastfeeding, do not take any medicines to quit smoking unless your doctor says it is okay. Talk with your doctor about counseling or other things that can help you.  Talk with your doctor about using more than one strategy at the same time, such as taking medicines while you are also going to  in-person counseling. This can help make quitting easier. What things can I do to make it easier to quit? Quitting smoking might feel very hard at first, but there is a lot that you can do to make it easier. Take these steps:  Talk to your family and friends. Ask them to support and encourage you.  Call phone quitlines, reach out to support groups, or work with a Veterinary surgeoncounselor.  Ask people who smoke to not smoke around you.  Avoid places that make you want (trigger) to smoke, such as: ? Bars. ? Parties. ? Smoke-break areas at work.  Spend time with people who do not smoke.  Lower the stress in your life. Stress can make you want to smoke. Try these things to help your stress: ? Getting regular exercise. ? Deep-breathing exercises. ? Yoga. ? Meditating. ? Doing a body scan. To do this, close your eyes, focus on one area of your body at a time from head to toe, and notice which parts of your body are tense. Try to relax the muscles in those areas.  Download or buy apps on your mobile phone or tablet that can help you stick to your quit plan. There are many free apps, such as QuitGuide from the [employer] developer(Centers for Disease Control and Prevention). You can find more support from smokefree.gov and other websites.  This information is not intended to replace advice given to you by your health care provider. Make sure you discuss any questions you  have with your health care provider. Document Released: 06/06/2009 Document Revised: 04/07/2016 Document Reviewed: 12/25/2014 Elsevier Interactive Patient Education  2018 [employer].

## 2017-10-27 NOTE — Progress Notes (Signed)
Patient ID: [name], female    DOB: 1981-01-30, 37 y.o.   MRN: [id number]  Chief Complaint  Patient presents with  . Establish Care    Allergies Patient has no known allergies.  Subjective:   Jocelyn Erickson is a 37 y.o. female who presents to [facility] today.  HPI Here to establish care as a new patient visit.  She is seen at [facility] for her gynecologic needs.  She is married with 4 children.  She is currently not working because she quit her job to have some time off.  She was previously a Investment banker, operational at [employer].  She does not plan on having more children.  She has been married for 11 years.  She reports that she is happy and safe in her marriage.  She does smoke cigarettes, approximately 1/2 pack/day for 17 years.  She occasionally drinks alcohol and smokes marijuana.  She denies any other drug use.  She reports that her energy levels are low and she has struggled with depression for several years.  She reports that she feels down and sad most days of the week.  She reports she sleeps more than she should.  Concentration is poor.  Does not have the motivation to go out and do things that she used to do for fun such as hanging out with friends.  Denies any suicidal or homicidal ideations.  Has never tried to hurt herself in the past.  Has never been hospitalized for her mood.  Has never been placed on any psychotropic medication.  Reports that she does occasionally cry.  She reports that she does not feel anxious or stressed but her mood is more of feeling depressed.  Would like to be screened for any sexually transmitted infections.  Denies any vaginal discharge, postcoital bleeding, vaginal itching.  Reports that she would also like some help with quitting smoking cigarettes.  She has tried numerous times in the past without success.  She reports the longest time she has quit has been 2 weeks.  She does have her first cigarette in the morning.  She  believes that her triggers are feeling down and not motivated.  She has never been on any medication to help her quit smoking.  No history of seizure disorder.  Has never used nicotine replacement in the past.  She reports that her appetite is low and she does not eat regularly scheduled meals.  She reports she finds it strange that she does not eat very much but is continued to gain weight.  She reports her weight has been stable for some time and she has been overweight for many years.  She denies any personal history of diabetes.  She does have a strong family history of diabetes, hypertension, and high cholesterol.  Lab review does show that she had an elevated blood sugar in 2013.  We have no record of lab testing done prior to that.    Past Medical History:  Diagnosis Date  . Trichimoniasis 04/06/2017  . Vaginal Pap smear, abnormal     Past Surgical History:  Procedure Laterality Date  . CESAREAN SECTION    . CHOLECYSTECTOMY  04/04/2012   Procedure: LAPAROSCOPIC CHOLECYSTECTOMY WITH INTRAOPERATIVE CHOLANGIOGRAM;  Surgeon: Dalia Heading, MD;  Location: [facility];  Service: General;  Laterality: N/A;  . TUBAL LIGATION      Family History  Problem Relation Age of Onset  . Diabetes Maternal Grandfather   . Hypertension  Mother      Social History   Socioeconomic History  . Marital status: Married    Spouse name: None  . Number of children: None  . Years of education: None  . Highest education level: None  Social Needs  . Financial resource strain: None  . Food insecurity - worry: None  . Food insecurity - inability: None  . Transportation needs - medical: None  . Transportation needs - non-medical: None  Occupational History  . None  Tobacco Use  . Smoking status: Current Every Day Smoker    Packs/day: 0.50    Years: 17.00    Pack years: 8.50    Types: Cigarettes  . Smokeless tobacco: Never Used  Substance and Sexual Activity  . Alcohol use: Yes    Comment:  occassionally  . Drug use: Yes    Types: Marijuana    Comment: once a month, last was two weeks ago 10/27/17  . Sexual activity: Yes    Partners: Male    Birth control/protection: Surgical    Comment: tubal   Other Topics Concern  . None  Social History Narrative   Worked as a Investment banker, operational at [employer].   Does not eat out a lot.   Eats all food groups.   Wears seatbelt.   Enjoys time with family.   Exercise by walking, 3 times a week.     Review of Systems  Constitutional: Positive for appetite change and fatigue.  HENT: Negative for trouble swallowing and voice change.   Eyes: Negative for visual disturbance.  Respiratory: Negative for cough, choking, chest tightness and shortness of breath.   Cardiovascular: Negative for chest pain, palpitations and leg swelling.  Gastrointestinal: Negative for abdominal pain, diarrhea and nausea.  Genitourinary: Negative for dysuria.  Neurological: Negative for tremors, light-headedness, numbness and headaches.  Hematological: Negative for adenopathy. Does not bruise/bleed easily.  Psychiatric/Behavioral: Positive for decreased concentration, dysphoric mood and sleep disturbance.     Objective:   BP 118/70 (BP Location: Left Arm, Patient Position: Sitting, Cuff Size: Normal)   Pulse 75   Temp 98 F (36.7 C) (Temporal)   Resp 16   Ht 5\' 4"  (1.626 m)   Wt 224 lb 12 oz (101.9 kg)   LMP 10/20/2017   SpO2 98%   BMI 38.58 kg/m   Physical Exam  Constitutional: She is oriented to person, place, and time. She appears well-developed and well-nourished. No distress.  HENT:  Head: Normocephalic and atraumatic.  Eyes: Pupils are equal, round, and reactive to light. No scleral icterus.  Neck: Normal range of motion. Neck supple. No JVD present. No tracheal deviation present. No thyromegaly present.  Cardiovascular: Normal rate, regular rhythm and normal heart sounds.  Pulmonary/Chest: Effort normal and breath sounds normal. No respiratory  distress.  Lymphadenopathy:    She has no cervical adenopathy.  Neurological: She is alert and oriented to person, place, and time. No cranial nerve deficit.  Skin: Skin is warm and dry.  Psychiatric:  Mood slightly dysthymic.  Affect congruent with mood.  Pleasant female with full range of motion.  Well dressed and well-groomed.  Behavior within normal limits.  Thought content without delusions, phobias, obsessions, or compulsions.  Memory is intact to immediate, recent, and remote recall.  No suicidal or homicidal ideations.  No auditory or visual hallucinations.  Speech with normal rate, rhythm, and prosody.  Nursing note and vitals reviewed.  Depression screen [facility] 2/9 10/27/2017 10/27/2017 04/02/2017  Decreased Interest 3 0 2  Down, Depressed, Hopeless 2 0 1  PHQ - 2 Score 5 0 3  Altered sleeping 2 - 0  Tired, decreased energy 3 - 3  Change in appetite 0 - 1  Feeling bad or failure about yourself  0 - 1  Trouble concentrating 3 - 1  Moving slowly or fidgety/restless 3 - 0  Suicidal thoughts 0 - 0  PHQ-9 Score 16 - 9    Assessment and Plan  1. Depression, major, single episode, moderate (HCC) Patient with no prior diagnosis of depression, but history seems that she has had depression bouts for the past several years.  At this time will try Wellbutrin XL, due to the fact the patient would like help with smoking cessation.  Patient was counseled regarding mechanism of action, possible side effects, and risks versus benefits of this medication.  Suicide risks evaluated and documented in note if present or in the area below.  Patient does not have/denies the following risks: previous suicide attempts, family history of suicide, prior history of psychiatric disorder, recent loss of a loved one, or severe hopelessness.  Patient has protective factors of family and community support.  Patient reports that family believes is behaving rationally. Patient displays problem solving skills.    Patient specifically denies suicide ideation. Patient has access/information to healthcare contacts if situation or mood changes where patient is a risk to self or others or mood becomes unstable.   During the encounter, the patient had good eye contact and firm handshake regarding safety contract and agreement to seek help if mood worsens and not to harm self.   Patient understands the treatment plan and is in agreement. Agrees to keep follow up and call prior or return to clinic if needed.   - buPROPion (WELLBUTRIN XL) 150 MG 24 hr tablet; Take 1 tablet (150 mg total) by mouth daily.  Dispense: 30 tablet; Refill: 0  2. Screen for STD (sexually transmitted disease) Ordered today. - HIV antibody - RPR - Hepatitis panel, acute - Urine cytology ancillary only  3. Immunization due  - Flu Vaccine QUAD 6+ mos PF IM (Fluarix Quad PF)-flu shot is deferred today by patient.  She was counseled concerning risk of influenza.  She defers vaccination. - Tdap vaccine greater than or equal to 7yo IM-Tdap vaccination is out of stock at this time.  We will plan to give her vaccination at her next scheduled visit.  4. Fatigue, unspecified type Suspect fatigue is secondary to her mood disorder.  Will check metabolic screening at this time. - COMPLETE METABOLIC PANEL WITH GFR - CBC with Differential/Platelet - TSH - VITAMIN D 25 Hydroxy (Vit-D Deficiency, Fractures) - Vitamin B12  5. Hyperglycemia Will screen patient for diabetes today.  We discussed that her weight and lack of exercise is a risk factor for diabetes in addition to her history of an elevated blood sugar.  Diet, exercise, and weight loss recommended. - Hemoglobin A1c  6. Tobacco abuse/marijuana use The 5 A's Model for treating Tobacco Use and Dependence was used today. I have identified and documented tobacco use status for this patient. I have urged the patient to quit tobacco and marijuana use. At this time, the patient is willing  and ready to attempt to quit cigarette use. We have discussed medication options to aid in smoking cessation including nicotine replacement therapy (NRT), zyban, and chantix. We have also discussed behavioral modifications, lifestyle changes, and patient support options to aid in tobacco cessation success. Patient was congratulated on their  desire to make this positive change for their health. Patient instructed to keep their follow up to ensure success.  At this time will try Wellbutrin XL for her mood and for tobacco cessation.  Her quit date is November 08, 2017.  OV was 45 minutes. Greater than 50% of office visit was spend counseling and coordinating care.  Return in about 3 weeks (around 11/17/2017) for follow up. Aliene Beams, MD 10/27/2017

## 2017-10-28 LAB — HEMOGLOBIN A1C
EAG (MMOL/L): 6.2 (calc)
HEMOGLOBIN A1C: 5.5 %{Hb} (ref <5.7)
MEAN PLASMA GLUCOSE: 111 (calc)

## 2017-10-28 LAB — COMPLETE METABOLIC PANEL WITH GFR
AG RATIO: 1.8 (calc) (ref 1.0–2.5)
ALT: 10 U/L (ref 6–29)
AST: 13 U/L (ref 10–30)
Albumin: 4.2 g/dL (ref 3.6–5.1)
Alkaline phosphatase (APISO): 79 U/L (ref 33–115)
BUN: 12 mg/dL (ref 7–25)
CALCIUM: 9.1 mg/dL (ref 8.6–10.2)
CO2: 27 mmol/L (ref 20–32)
Chloride: 107 mmol/L (ref 98–110)
Creat: 0.7 mg/dL (ref 0.50–1.10)
GFR, EST NON AFRICAN AMERICAN: 110 mL/min/{1.73_m2} (ref >=60)
GFR, Est African American: 127 mL/min/{1.73_m2} (ref >=60)
GLUCOSE: 88 mg/dL (ref 65–139)
Globulin: 2.4 g/dL (calc) (ref 1.9–3.7)
POTASSIUM: 4.1 mmol/L (ref 3.5–5.3)
Sodium: 140 mmol/L (ref 135–146)
Total Bilirubin: 0.3 mg/dL (ref 0.2–1.2)
Total Protein: 6.6 g/dL (ref 6.1–8.1)

## 2017-10-28 LAB — CBC WITH DIFFERENTIAL/PLATELET
BASOS PCT: 0.7 %
Basophils Absolute: 53 cells/uL (ref 0–200)
EOS ABS: 91 {cells}/uL (ref 15–500)
Eosinophils Relative: 1.2 %
HCT: 31.4 % — ABNORMAL LOW (ref 35.0–45.0)
HEMOGLOBIN: 9.4 g/dL — AB (ref 11.7–15.5)
Lymphs Abs: 2212 cells/uL (ref 850–3900)
MCH: 21.6 pg — ABNORMAL LOW (ref 27.0–33.0)
MCHC: 29.9 g/dL — ABNORMAL LOW (ref 32.0–36.0)
MCV: 72 fL — ABNORMAL LOW (ref 80.0–100.0)
MONOS PCT: 7.3 %
MPV: 10.3 fL (ref 7.5–12.5)
NEUTROS ABS: 4689 {cells}/uL (ref 1500–7800)
Neutrophils Relative %: 61.7 %
Platelets: 385 10*3/uL (ref 140–400)
RBC: 4.36 10*6/uL (ref 3.80–5.10)
RDW: 15.8 % — ABNORMAL HIGH (ref 11.0–15.0)
Total Lymphocyte: 29.1 %
WBC mixed population: 555 cells/uL (ref 200–950)
WBC: 7.6 10*3/uL (ref 3.8–10.8)

## 2017-10-28 LAB — HEPATITIS PANEL, ACUTE
Hep A IgM: NONREACTIVE
Hep B C IgM: NONREACTIVE
Hepatitis B Surface Ag: NONREACTIVE
Hepatitis C Ab: NONREACTIVE
SIGNAL TO CUT-OFF: 0.01 (ref <1.00)

## 2017-10-28 LAB — RPR: RPR: NONREACTIVE

## 2017-10-28 LAB — VITAMIN D 25 HYDROXY (VIT D DEFICIENCY, FRACTURES): Vit D, 25-Hydroxy: 9 ng/mL — ABNORMAL LOW (ref 30–100)

## 2017-10-28 LAB — HIV ANTIBODY (ROUTINE TESTING W REFLEX): HIV: NONREACTIVE

## 2017-10-28 LAB — URINE CYTOLOGY ANCILLARY ONLY
CHLAMYDIA, DNA PROBE: NEGATIVE
NEISSERIA GONORRHEA: NEGATIVE
TRICH (WINDOWPATH): NEGATIVE

## 2017-10-28 LAB — VITAMIN B12: VITAMIN B 12: 394 pg/mL (ref 200–1100)

## 2017-10-28 LAB — TSH: TSH: 1.38 m[IU]/L

## 2017-11-02 ENCOUNTER — Telehealth: Payer: Self-pay | Admitting: Family Medicine

## 2017-11-02 DIAGNOSIS — D500 Iron deficiency anemia secondary to blood loss (chronic): Secondary | ICD-10-CM

## 2017-11-02 MED ORDER — VITAMIN D (ERGOCALCIFEROL) 1.25 MG (50000 UNIT) PO CAPS: 50000.0000 [IU] | ORAL_CAPSULE | ORAL | 0 refills | Status: DC

## 2017-11-02 NOTE — Telephone Encounter (Signed)
Please call patient and advised that her hemoglobin is low and consistent with anemia.  I suspect this is due to blood loss from her heavy periods.  I have ordered some additional blood blood work to assess her iron levels.  Please come to the lab for additional blood work.  I have also placed a referral for her to follow-up with a gynecologist regarding her heavy periods and to see what intervention would be possible.  Her hemoglobin is 9.4.  In addition, please advise her that her thyroid function is normal.  Her labs show that she is not diabetic.  Her HIV and syphilis tests are negative.  Her urine testing is negative for gonorrhea, chlamydia, and Trichomonas.  Advised her that her vitamin D levels are severely deficient.  She needs to start vitamin D 50,000 units, 1 p.o. weekly.  She needs to do this for 3 months and then we will recheck her levels.  I have sent this medication to the pharmacy.  In addition, I would like for her to get B12 1000 mcg tablets.  She should take 1 pill by mouth each day.  I believe that her vitamin deficiencies and anemia is contributing to her fatigue.  Please advise her to keep her scheduled follow-up with me and we will discuss her labs in more detail.  After she comes back to the lab for the additional iron testing I will let her know if we need to start iron supplementation.  The labs have been placed and I would like for her to get those done this week.

## 2017-11-03 NOTE — Telephone Encounter (Signed)
Called patient regarding message below. No answer, left generic message for patient to return call.   

## 2017-11-08 NOTE — Telephone Encounter (Signed)
Called patient regarding message below. No answer, left generic message for patient to return call.   

## 2017-11-08 NOTE — Telephone Encounter (Signed)
Patient left message returning my call. I called back and left another message.

## 2017-11-12 NOTE — Telephone Encounter (Signed)
I have been unable to reach this patient by phone.  A letter is being sent.

## 2017-11-22 ENCOUNTER — Ambulatory Visit: Payer: Self-pay | Admitting: Adult Health

## 2017-11-28 ENCOUNTER — Other Ambulatory Visit: Payer: Self-pay | Admitting: Family Medicine

## 2017-11-28 DIAGNOSIS — F321 Major depressive disorder, single episode, moderate: Secondary | ICD-10-CM

## 2017-11-30 ENCOUNTER — Ambulatory Visit: Payer: Medicaid Other | Admitting: Family Medicine

## 2018-01-28 ENCOUNTER — Encounter: Payer: Self-pay | Admitting: Family Medicine

## 2018-09-01 ENCOUNTER — Other Ambulatory Visit: Payer: Self-pay

## 2018-09-01 ENCOUNTER — Encounter: Payer: Self-pay | Admitting: *Deleted

## 2018-09-01 DIAGNOSIS — F321 Major depressive disorder, single episode, moderate: Secondary | ICD-10-CM

## 2020-05-03 ENCOUNTER — Ambulatory Visit: Payer: Medicaid Other | Admitting: Adult Health

## 2021-02-15 ENCOUNTER — Emergency Department (HOSPITAL_COMMUNITY)
Admission: EM | Admit: 2021-02-15 | Discharge: 2021-02-15 | Disposition: A | Discharge status: Home / Self Care | Payer: Medicaid Other | Attending: Emergency Medicine | Admitting: Emergency Medicine

## 2021-02-15 ENCOUNTER — Encounter (HOSPITAL_COMMUNITY): Payer: Self-pay

## 2021-02-15 ENCOUNTER — Other Ambulatory Visit: Payer: Self-pay

## 2021-02-15 DIAGNOSIS — M25562 Pain in left knee: Secondary | ICD-10-CM | POA: Insufficient documentation

## 2021-02-15 DIAGNOSIS — G8929 Other chronic pain: Secondary | ICD-10-CM | POA: Insufficient documentation

## 2021-02-15 DIAGNOSIS — F17210 Nicotine dependence, cigarettes, uncomplicated: Secondary | ICD-10-CM | POA: Diagnosis not present

## 2021-02-15 MED ORDER — PREDNISONE 20 MG PO TABS: 20.0000 mg | ORAL_TABLET | Freq: Every day | ORAL | 0 refills | Status: AC

## 2021-02-15 NOTE — Discharge Instructions (Addendum)
I suspect knee pain is from a ligament injury versus meniscus injury.  I have given you a prescription for steroids please take as prescribed.  I recommend taking ibuprofen every 6-8 hours for the next 7 to 10 days.  Please wear brace during the day you may take off at nighttime.  Please follow-up with orthopedic surgery for further evaluation.  Come back to the emergency department if you develop chest pain, shortness of breath, severe abdominal pain, uncontrolled nausea, vomiting, diarrhea.

## 2021-02-15 NOTE — ED Provider Notes (Signed)
[facility] EMERGENCY DEPARTMENT Provider Note   CSN: [id number] Arrival date & time: 02/15/21  1005     History Chief Complaint  Patient presents with   Knee Pain    Jocelyn Erickson is a 40 y.o. female.  HPI  Patient with no significant medical history presents to the emergency department with chief complaint of left knee pain.  Patient states this pain has gone on for the last month, states that it has gotten worse and she would like it checked out.  She states that the pain is worsened when she ambulates, worsen when she takes a step onto an elevated surface, she describes the pain as something rubbing between her knee, she denies  recent trauma to the area, denies history of IV drug use, denies systemic infection like fevers or chills, she has no paresthesias or weakness in the lower extremities.  She denies calf pain, shortness of breath or chest pain.  Patient states she is never had  chronic knee pain in the past, she states she been taking intermittent ibuprofen without much relief.  Patient denies systemic rash, fevers, chills, vaginal discharge, vaginal bleeding or other joint pain.  Past Medical History:  Diagnosis Date   Trichimoniasis 04/06/2017   Vaginal Pap smear, abnormal     Patient Active Problem List   Diagnosis Date Noted   Depression, major, single episode, moderate (HCC) 10/27/2017   Screen for STD (sexually transmitted disease) 10/27/2017   Immunization due 10/27/2017   Fatigue 10/27/2017   Hyperglycemia 10/27/2017   Tobacco abuse 10/27/2017   Marijuana use 10/27/2017   Trichimoniasis 04/06/2017   Itching in the vaginal area 04/02/2017   Encounter for routine gynecological examination with Papanicolaou smear of cervix 04/02/2017    Past Surgical History:  Procedure Laterality Date   CESAREAN SECTION     CHOLECYSTECTOMY  04/04/2012   Procedure: LAPAROSCOPIC CHOLECYSTECTOMY WITH INTRAOPERATIVE CHOLANGIOGRAM;  Surgeon: Dalia Heading, MD;  Location: [facility];  Service: General;  Laterality: N/A;   TUBAL LIGATION       OB History     Gravida  4   Para  4   Term      Preterm      AB      Living  4      SAB      IAB      Ectopic      Multiple      Live Births              Family History  Problem Relation Age of Onset   Diabetes Maternal Grandfather    Hypertension Mother     Social History   Tobacco Use   Smoking status: Every Day    Packs/day: 0.50    Years: 17.00    Pack years: 8.50    Types: Cigarettes   Smokeless tobacco: Never  Vaping Use   Vaping Use: Never used  Substance Use Topics   Alcohol use: Yes    Comment: occassionally   Drug use: Yes    Types: Marijuana    Comment: once a month, last was two weeks ago 10/27/17    Home Medications Prior to Admission medications   Medication Sig Start Date End Date Taking? Authorizing Provider  predniSONE (DELTASONE) 20 MG tablet Take 1 tablet (20 mg total) by mouth daily for 5 days. 02/15/21 02/20/21 Yes Carroll Sage, PA-C  buPROPion (WELLBUTRIN XL) 150 MG 24 hr tablet TAKE 1 TABLET BY MOUTH DAILY  11/29/17   Aliene Beams, MD  fluconazole (DIFLUCAN) 150 MG tablet Take 1 each month before period as needed Patient not taking: Reported on 10/27/2017 04/02/17   Adline Potter, NP  metroNIDAZOLE (FLAGYL) 500 MG tablet Take 1 tablet (500 mg total) by mouth 3 (three) times daily. Patient not taking: Reported on 10/27/2017 04/06/17   Cyril Mourning A, NP  naproxen (NAPROSYN) 500 MG tablet Take 1 tablet (500 mg total) by mouth 2 (two) times daily. Patient not taking: Reported on 04/02/2017 02/02/16   Bethann Berkshire, MD  naproxen sodium (ALEVE) 220 MG tablet Take 220 mg by mouth daily as needed (for pain).    [provider]  Vitamin D, Ergocalciferol, (DRISDOL) 50000 units CAPS capsule Take 1 capsule (50,000 Units total) by mouth every 7 (seven) days. 11/02/17   Aliene Beams, MD    Allergies    Patient has no known allergies.  Review of  Systems   Review of Systems  Constitutional:  Negative for chills and fever.  HENT:  Negative for congestion.   Respiratory:  Negative for shortness of breath.   Cardiovascular:  Negative for chest pain.  Gastrointestinal:  Negative for abdominal pain.  Genitourinary:  Negative for enuresis.  Musculoskeletal:  Negative for back pain.       Left knee pain.  Skin:  Negative for rash.  Neurological:  Negative for dizziness.  Hematological:  Does not bruise/bleed easily.   Physical Exam Updated Vital Signs BP (!) 142/75 (BP Location: Left Arm)   Pulse 77   Temp 98.3 F (36.8 C) (Oral)   Resp 18   Ht 5\' 2"  (1.575 m)   Wt 90.7 kg   SpO2 100%   BMI 36.58 kg/m   Physical Exam Vitals and nursing note reviewed.  Constitutional:      General: She is not in acute distress.    Appearance: She is not ill-appearing.  HENT:     Head: Normocephalic and atraumatic.     Nose: No congestion.  Eyes:     Conjunctiva/sclera: Conjunctivae normal.  Cardiovascular:     Rate and Rhythm: Normal rate and regular rhythm.     Pulses: Normal pulses.  Pulmonary:     Effort: Pulmonary effort is normal.  Musculoskeletal:        General: Tenderness present. No swelling.     Right lower leg: No edema.     Left lower leg: No edema.     Comments: Patient's lower extremities were visualized there is no unilateral leg swelling, no erythema or edema present.  Patient has full range of motion her lower extremities, she was slightly tender along the medial aspect of her tibial plateau, no gross deformities present.  There is no significant joint laxity within the knee.  She had no tenderness along her calf, neurovascular fully intact.  Skin:    General: Skin is warm and dry.  Neurological:     Mental Status: She is alert.  Psychiatric:        Mood and Affect: Mood normal.    ED Results / Procedures / Treatments   Labs (all labs ordered are listed, but only abnormal results are displayed) Labs Reviewed  - No data to display  EKG None  Radiology No results found.  Procedures Procedures   Medications Ordered in ED Medications - No data to display  ED Course  I have reviewed the triage vital signs and the nursing notes.  Pertinent labs & imaging results that were available during  my care of the patient were reviewed by me and considered in my medical decision making (see chart for details).    MDM Rules/Calculators/A&P                         Initial impression-patient presents with acute on chronicLeft knee pain.  She is alert, does not appear to be in acute distress, vital signs reassuring.  Work-up-due to well-appearing patient, benign physical exam, further lab work and imaging not warranted at this time  Rule out- I have low suspicion for septic arthritis as patient denies IV drug use, skin exam was performed no erythematous, edematous, warm joints noted on exam.  Low suspicion for fracture or dislocation As patient denies traumatic injury to the area, there is no gross deformities present, will defer imaging at this time. low suspicion for ligament or tendon damage as area was palpated no gross defects noted,she has full range of motion as well as 5/5 strength.  Low suspicion for compartment syndrome as area was palpated it was soft to the touch, neurovascular fully intact.  Low suspicion for DVT as patient has no Calf pain, no unilateral leg swelling presentation more consistent with a orthopedic injury of the knee.  Left knee pain suspect- secondary due to partial ligament tear versus meniscus injury.  Will encourage daily NSAIDs, starting short course of steroids, provide with a brace and have her follow-up with orthopedic surgery for further evaluation.  Vital signs have remained stable, no indication for hospital admission.  Patient given at home care as well strict return precautions.  Patient verbalized that they understood agreed to said plan.  Final Clinical Impression(s)  / ED Diagnoses Final diagnoses:  Chronic pain of left knee    Rx / DC Orders ED Discharge Orders          Ordered    predniSONE (DELTASONE) 20 MG tablet  Daily        02/15/21 1050             Barnie Del 02/15/21 1053    Pollyann Savoy, MD 02/15/21 1329

## 2021-02-15 NOTE — ED Triage Notes (Signed)
Pt to er, pt states that she is here for L knee pain and swelling for the past month, states that it hurts sometimes when she steps.

## 2021-03-05 ENCOUNTER — Ambulatory Visit (INDEPENDENT_AMBULATORY_CARE_PROVIDER_SITE_OTHER): Payer: Medicaid Other | Admitting: Adult Health

## 2021-03-05 ENCOUNTER — Encounter: Payer: Self-pay | Admitting: Adult Health

## 2021-03-05 ENCOUNTER — Other Ambulatory Visit: Payer: Self-pay

## 2021-03-05 ENCOUNTER — Other Ambulatory Visit (HOSPITAL_COMMUNITY)
Admission: RE | Admit: 2021-03-05 | Discharge: 2021-03-05 | Disposition: A | Discharge status: Home / Self Care | Payer: Medicaid Other | Source: Ambulatory Visit | Attending: Adult Health | Admitting: Adult Health

## 2021-03-05 VITALS — BP 134/76 | HR 100 | Ht 62.0 in | Wt 234.4 lb

## 2021-03-05 DIAGNOSIS — Z1231 Encounter for screening mammogram for malignant neoplasm of breast: Secondary | ICD-10-CM | POA: Diagnosis not present

## 2021-03-05 DIAGNOSIS — N921 Excessive and frequent menstruation with irregular cycle: Secondary | ICD-10-CM | POA: Insufficient documentation

## 2021-03-05 DIAGNOSIS — F419 Anxiety disorder, unspecified: Secondary | ICD-10-CM

## 2021-03-05 DIAGNOSIS — N946 Dysmenorrhea, unspecified: Secondary | ICD-10-CM | POA: Insufficient documentation

## 2021-03-05 DIAGNOSIS — Z01419 Encounter for gynecological examination (general) (routine) without abnormal findings: Secondary | ICD-10-CM | POA: Insufficient documentation

## 2021-03-05 DIAGNOSIS — F321 Major depressive disorder, single episode, moderate: Secondary | ICD-10-CM

## 2021-03-05 DIAGNOSIS — F32A Depression, unspecified: Secondary | ICD-10-CM

## 2021-03-05 DIAGNOSIS — F17200 Nicotine dependence, unspecified, uncomplicated: Secondary | ICD-10-CM | POA: Insufficient documentation

## 2021-03-05 MED ORDER — BUPROPION HCL ER (XL) 150 MG PO TB24: 150.0000 mg | ORAL_TABLET | Freq: Every day | ORAL | 6 refills | Status: DC

## 2021-03-05 NOTE — Progress Notes (Signed)
Patient ID: Jocelyn Erickson, female   DOB: 1981/06/09, 40 y.o.   MRN: [id number] History of Present Illness: Jocelyn Erickson is a 40 year old black female,married, G4P4 in for a well woman gyn exam and pap. Has had period for 2 weeks, heavy at times with cramping.    Current Medications, Allergies, Past Medical History, Past Surgical History, Family History and Social History were reviewed in [employer] record.     Review of Systems: Patient denies any headaches, hearing loss, fatigue, blurred vision, shortness of breath, chest pain, abdominal pain, problems with bowel movements, urination, or intercourse. No joint pain or mood swings.  See HPI for positives.     Physical Exam:BP 134/76 (BP Location: Left Arm, Patient Position: Sitting, Cuff Size: Large)   Pulse 100   Ht 5\' 2"  (1.575 m)   Wt 234 lb 6.4 oz (106.3 kg)   LMP 02/20/2021   BMI 42.87 kg/m   General:  Well developed, well nourished, no acute distress Skin:  Warm and dry Neck:  Midline trachea, normal thyroid, good ROM, no lymphadenopathy Lungs; Clear to auscultation bilaterally Breast:  No dominant palpable mass, retraction, or nipple discharge Cardiovascular: Regular rate and rhythm Abdomen:  Soft, non tender, no hepatosplenomegaly Pelvic:  External genitalia is normal in appearance, no lesions.  The vagina is normal in appearance. +period blood. Urethra has no lesions or masses. The cervix is bulbous.  Pap with GC/CHL and HR HPV genotyping performed.Uterus is felt to be normal size, shape, and contour.  No adnexal masses or tenderness noted.Bladder is non tender, no masses felt. Rectal: Deferred  Extremities/musculoskeletal:  No swelling or varicosities noted, no clubbing or cyanosis Psych:  No mood changes, alert and cooperative,seems happy AA is 1 Fall risk is low Depression screen [facility] 2/9 03/05/2021 10/27/2017 10/27/2017  Decreased Interest 3 3 0  Down, Depressed, Hopeless 3 2 0  PHQ - 2 Score 6 5 0   Altered sleeping 3 2 -  Tired, decreased energy 3 3 -  Change in appetite 3 0 -  Feeling bad or failure about yourself  1 0 -  Trouble concentrating 2 3 -  Moving slowly or fidgety/restless 1 3 -  Suicidal thoughts 0 0 -  PHQ-9 Score 19 16 -    GAD 7 : Generalized Anxiety Score 03/05/2021  Nervous, Anxious, on Edge 3  Control/stop worrying 3  Worry too much - different things 3  Trouble relaxing 3  Restless 3  Easily annoyed or irritable 3  Afraid - awful might happen 3  Total GAD 7 Score 21  Will rx Wellbutrin as used in the past   Upstream - 03/05/21 1346       Pregnancy Intention Screening   Does the patient want to become pregnant in the next year? No    Does the patient's partner want to become pregnant in the next year? No    Would the patient like to discuss contraceptive options today? No      Contraception Wrap Up   Current Method Female Sterilization    End Method Female Sterilization    Contraception Counseling Provided No            Pt gave verbal consent for exam without chaperone       Impression and plan: 1. Encounter for gynecological examination with Papanicolaou smear of cervix  Pap sent Physical in 1 year Pap in 3 if normal Will check labs,sine no PCP - Cytology - PAP( [place]) -  CBC - Comprehensive metabolic panel - TSH - Lipid panel  2. Menorrhagia with irregular cycle [place] scheduled at [facility] 03/12/21 at 11 am,will talk when [place] back  - US PELVIC COMPLETE WITH TRANSVAGINAL; Future  3. Dysmenorrhea - US PELVIC COMPLETE WITH TRANSVAGINAL; Future  4. Screening mammogram for breast cancer Mammogram scheduled for her 03/12/21 at [facility] at 8:30 am  - MM 3D SCREEN BREAST BILATERAL; Future  5. Anxiety and depression Will rx Wellbutrin  Meds ordered this encounter  Medications   buPROPion (WELLBUTRIN XL) 150 MG 24 hr tablet    Sig: Take 1 tablet (150 mg total) by mouth daily.    Dispense:  30 tablet    Refill:  6    Order  Specific Question:   Supervising Provider    Answer:   Duane Lope H [[id number]]   Follow up in 8 weeks for ROS  6. Smoker She is aware that Wellbutrin may help her stop smoking

## 2021-03-06 ENCOUNTER — Ambulatory Visit: Payer: Medicaid Other | Admitting: Orthopaedic Surgery

## 2021-03-06 ENCOUNTER — Encounter: Payer: Self-pay | Admitting: Orthopaedic Surgery

## 2021-03-06 ENCOUNTER — Ambulatory Visit: Payer: Medicaid Other

## 2021-03-06 VITALS — BP 137/64 | HR 91 | Ht 62.0 in | Wt 232.4 lb

## 2021-03-06 DIAGNOSIS — M25562 Pain in left knee: Secondary | ICD-10-CM | POA: Diagnosis not present

## 2021-03-06 DIAGNOSIS — G8929 Other chronic pain: Secondary | ICD-10-CM

## 2021-03-06 LAB — CBC
Hematocrit: 32 % — ABNORMAL LOW (ref 34.0–46.6)
Hemoglobin: 9.7 g/dL — ABNORMAL LOW (ref 11.1–15.9)
MCH: 23 pg — ABNORMAL LOW (ref 26.6–33.0)
MCHC: 30.3 g/dL — ABNORMAL LOW (ref 31.5–35.7)
MCV: 76 fL — ABNORMAL LOW (ref 79–97)
Platelets: 282 x10E3/uL (ref 150–450)
RBC: 4.22 x10E6/uL (ref 3.77–5.28)
RDW: 15.7 % — ABNORMAL HIGH (ref 11.7–15.4)
WBC: 10.9 x10E3/uL — ABNORMAL HIGH (ref 3.4–10.8)

## 2021-03-06 LAB — LIPID PANEL
Chol/HDL Ratio: 6 ratio — ABNORMAL HIGH (ref 0.0–4.4)
Cholesterol, Total: 162 mg/dL (ref 100–199)
HDL: 27 mg/dL — ABNORMAL LOW (ref >39)
LDL Chol Calc (NIH): 108 mg/dL — ABNORMAL HIGH (ref 0–99)
Triglycerides: 152 mg/dL — ABNORMAL HIGH (ref 0–149)
VLDL Cholesterol Cal: 27 mg/dL (ref 5–40)

## 2021-03-06 LAB — COMPREHENSIVE METABOLIC PANEL
ALT: 14 IU/L (ref 0–32)
AST: 10 IU/L (ref 0–40)
Albumin/Globulin Ratio: 1.7 (ref 1.2–2.2)
Albumin: 4.3 g/dL (ref 3.8–4.8)
Alkaline Phosphatase: 91 IU/L (ref 44–121)
BUN/Creatinine Ratio: 13 (ref 9–23)
BUN: 9 mg/dL (ref 6–24)
Bilirubin Total: <0.2 mg/dL (ref 0.0–1.2)
CO2: 23 mmol/L (ref 20–29)
Calcium: 9 mg/dL (ref 8.7–10.2)
Chloride: 102 mmol/L (ref 96–106)
Creatinine, Ser: 0.68 mg/dL (ref 0.57–1.00)
Globulin, Total: 2.6 g/dL (ref 1.5–4.5)
Glucose: 99 mg/dL (ref 65–99)
Potassium: 3.8 mmol/L (ref 3.5–5.2)
Sodium: 141 mmol/L (ref 134–144)
Total Protein: 6.9 g/dL (ref 6.0–8.5)
eGFR: 111 mL/min/{1.73_m2} (ref >59)

## 2021-03-06 LAB — TSH: TSH: 2.02 u[IU]/mL (ref 0.450–4.500)

## 2021-03-06 MED ORDER — NAPROXEN 500 MG PO TABS: 500.0000 mg | ORAL_TABLET | Freq: Two times a day (BID) | ORAL | 5 refills | Status: DC

## 2021-03-06 NOTE — Progress Notes (Signed)
Subjective:    Patient ID: Jocelyn Erickson, female    DOB: Oct 18, 1980, 40 y.o.   MRN: [id number]  HPI She slipped in the shower about two months ago and twisted her left knee.  It has been hurting since then getting progressively worse.  She has swelling, popping and more recently giving way of the left knee.  She was seen in the ER on 02-15-21 for this.  No X-rays were done. I have reviewed the notes.  She has tried ice, heat, Tylenol with no help.  She says the pain is worse.  She has no new trauma.     Review of Systems  Constitutional:  Positive for activity change.  Musculoskeletal:  Positive for arthralgias, gait problem and joint swelling.  All other systems reviewed and are negative. For Review of Systems, all other systems reviewed and are negative.  The following is a summary of the past history medically, past history surgically, known current medicines, social history and family history.  This information is gathered electronically by the computer from prior information and documentation.  I review this each visit and have found including this information at this point in the chart is beneficial and informative.   Past Medical History:  Diagnosis Date   Trichimoniasis 04/06/2017   Vaginal Pap smear, abnormal     Past Surgical History:  Procedure Laterality Date   CESAREAN SECTION     CHOLECYSTECTOMY  04/04/2012   Procedure: LAPAROSCOPIC CHOLECYSTECTOMY WITH INTRAOPERATIVE CHOLANGIOGRAM;  Surgeon: Dalia Heading, MD;  Location: [facility];  Service: General;  Laterality: N/A;   TUBAL LIGATION      Current Outpatient Medications on File Prior to Visit  Medication Sig Dispense Refill   buPROPion (WELLBUTRIN XL) 150 MG 24 hr tablet Take 1 tablet (150 mg total) by mouth daily. 30 tablet 6   No current facility-administered medications on file prior to visit.    Social History   Socioeconomic History   Marital status: Married    Spouse name: Not on file   Number of  children: Not on file   Years of education: Not on file   Highest education level: Not on file  Occupational History   Not on file  Tobacco Use   Smoking status: Every Day    Packs/day: 0.50    Years: 17.00    Pack years: 8.50    Types: Cigarettes   Smokeless tobacco: Never  Vaping Use   Vaping Use: Never used  Substance and Sexual Activity   Alcohol use: Yes    Comment: occassionally   Drug use: Not Currently    Types: Marijuana    Comment: once a month, last was two weeks ago 10/27/17   Sexual activity: Yes    Partners: Male    Birth control/protection: Surgical    Comment: tubal   Other Topics Concern   Not on file  Social History Narrative   Worked as a Investment banker, operational at [employer].   Does not eat out a lot.   Eats all food groups.   Wears seatbelt.   Enjoys time with family.   Exercise by walking, 3 times a week.    Social Determinants of Health   Financial Resource Strain: Low Risk    Difficulty of Paying Living Expenses: Not very hard  Food Insecurity: Food Insecurity Present   Worried About Running Out of Food in the Last Year: Never true   Ran Out of Food in the Last Year: Sometimes true  Transportation Needs: No Regulatory affairs officer (Medical): No   Lack of Transportation (Non-Medical): No  Physical Activity: Insufficiently Active   Days of Exercise per Week: 1 day   Minutes of Exercise per Session: 30 min  Stress: Stress Concern Present   Feeling of Stress : To some extent  Social Connections: Moderately Integrated   Frequency of Communication with Friends and Family: More than three times a week   Frequency of Social Gatherings with Friends and Family: Once a week   Attends Religious Services: 1 to 4 times per year   Active Member of [employer] or Organizations: No   Attends Banker Meetings: Never   Marital Status: Married  Catering manager Violence: Not At Risk   Fear of Current or Ex-Partner: No   Emotionally Abused: No    Physically Abused: No   Sexually Abused: No    Family History  Problem Relation Age of Onset   Diabetes Maternal Grandfather    Hypertension Mother     BP 137/64   Pulse 91   Ht 5\' 2"  (1.575 m)   Wt 232 lb 6.4 oz (105.4 kg)   LMP 02/20/2021   BMI 42.51 kg/m   Body mass index is 42.51 kg/m.     Objective:   Physical Exam Vitals and nursing note reviewed. Exam conducted with a chaperone present.  Constitutional:      Appearance: She is well-developed.  HENT:     Head: Normocephalic and atraumatic.  Eyes:     Conjunctiva/sclera: Conjunctivae normal.     Pupils: Pupils are equal, round, and reactive to light.  Cardiovascular:     Rate and Rhythm: Normal rate and regular rhythm.  Pulmonary:     Effort: Pulmonary effort is normal.  Abdominal:     Palpations: Abdomen is soft.  Musculoskeletal:     Cervical back: Normal range of motion and neck supple.       Legs:  Skin:    General: Skin is warm and dry.  Neurological:     Mental Status: She is alert and oriented to person, place, and time.     Cranial Nerves: No cranial nerve deficit.     Motor: No abnormal muscle tone.     Coordination: Coordination normal.     Deep Tendon Reflexes: Reflexes are normal and symmetric. Reflexes normal.  Psychiatric:        Behavior: Behavior normal.        Thought Content: Thought content normal.        Judgment: Judgment normal.  X-rays were done of the left knee, reported separately.        Assessment & Plan:   Encounter Diagnosis  Name Primary?   Chronic pain of left knee Yes   I am concerned about meniscus tear of the left knee medially.  She may need MRI.  PROCEDURE NOTE:  The patient requests injections of the left knee , verbal consent was obtained.  The left knee was prepped appropriately after time out was performed.   Sterile technique was observed and injection of 1 cc of Celestone 6 mg with several cc's of plain xylocaine. Anesthesia was provided by  ethyl chloride and a 20-gauge needle was used to inject the knee area. The injection was tolerated well.  A band aid dressing was applied.  The patient was advised to apply ice later today and tomorrow to the injection sight as needed.   I will begin Naprosyn 500 po bid  pc.  Call if any problem.  Precautions discussed.  Return in five weeks.  Electronically Signed Darreld Mclean, MD 7/14/20228:33 AM

## 2021-03-11 LAB — CYTOLOGY - PAP
Chlamydia: NEGATIVE
Comment: NEGATIVE
Comment: NEGATIVE
Comment: NORMAL
Diagnosis: NEGATIVE
Diagnosis: REACTIVE
High risk HPV: NEGATIVE
Neisseria Gonorrhea: NEGATIVE

## 2021-03-12 ENCOUNTER — Ambulatory Visit (HOSPITAL_COMMUNITY)
Admission: RE | Admit: 2021-03-12 | Discharge: 2021-03-12 | Disposition: A | Discharge status: Home / Self Care | Payer: Medicaid Other | Source: Ambulatory Visit | Attending: Adult Health | Admitting: Adult Health

## 2021-03-12 ENCOUNTER — Other Ambulatory Visit: Payer: Self-pay

## 2021-03-12 DIAGNOSIS — N946 Dysmenorrhea, unspecified: Secondary | ICD-10-CM

## 2021-03-12 DIAGNOSIS — Z1231 Encounter for screening mammogram for malignant neoplasm of breast: Secondary | ICD-10-CM | POA: Insufficient documentation

## 2021-03-12 DIAGNOSIS — N921 Excessive and frequent menstruation with irregular cycle: Secondary | ICD-10-CM | POA: Insufficient documentation

## 2021-04-01 ENCOUNTER — Encounter: Payer: Self-pay | Admitting: Adult Health

## 2021-04-01 ENCOUNTER — Ambulatory Visit: Payer: Medicaid Other | Admitting: Adult Health

## 2021-04-01 ENCOUNTER — Other Ambulatory Visit: Payer: Self-pay

## 2021-04-01 VITALS — BP 134/76 | HR 95 | Ht 62.0 in | Wt 235.0 lb

## 2021-04-01 DIAGNOSIS — N921 Excessive and frequent menstruation with irregular cycle: Secondary | ICD-10-CM | POA: Diagnosis not present

## 2021-04-01 DIAGNOSIS — Z3202 Encounter for pregnancy test, result negative: Secondary | ICD-10-CM | POA: Diagnosis not present

## 2021-04-01 DIAGNOSIS — D500 Iron deficiency anemia secondary to blood loss (chronic): Secondary | ICD-10-CM | POA: Diagnosis not present

## 2021-04-01 LAB — POCT HEMOGLOBIN: Hemoglobin: 7.6 g/dL — AB (ref 11–14.6)

## 2021-04-01 LAB — POCT URINE PREGNANCY: Preg Test, Ur: NEGATIVE

## 2021-04-01 MED ORDER — CHROMAGEN PO CAPS: 1.0000 | ORAL_CAPSULE | Freq: Every day | ORAL | 0 refills | Status: AC

## 2021-04-01 MED ORDER — IRON 90 (18 FE) MG PO TABS: ORAL_TABLET | ORAL | 0 refills | Status: DC

## 2021-04-01 NOTE — Progress Notes (Signed)
  Subjective:     Patient ID: Jocelyn Erickson, female   DOB: 1981-06-25, 40 y.o.   MRN: [id number]  HPI Jocelyn Erickson is a 40 year old black female,married, G4P4 back in follow up on [place] and labs. She bleed from 02/03/21 to 03/21/21.  Lab Results  Component Value Date   DIAGPAP  03/05/2021    - Negative for Intraepithelial Lesions or Malignancy (NILM)   DIAGPAP - Benign reactive/reparative changes 03/05/2021   HPV NOT DETECTED 04/02/2017   HPVHIGH Negative 03/05/2021    Review of Systems Bleeding stopped 03/21/21 Denies any fatigue or dizziness or shortness of breath Does crave ice Reviewed past medical,surgical, social and family history. Reviewed medications and allergies.     Objective:   Physical Exam BP 134/76 (BP Location: Left Arm, Patient Position: Sitting, Cuff Size: Normal)   Pulse 95   Ht 5\' 2"  (1.575 m)   Wt 235 lb (106.6 kg)   LMP 02/03/2021 (Approximate)   BMI 42.98 kg/m  HGB 7.6, UPT is negative. Skin warm and dry. Lungs: clear to ausculation bilaterally. Cardiovascular: regular rate and rhythm.  Reviewed 02/05/2021 with her and husband:has simple cyst both ovaries, 4.2 cm on right and 3.5 cm on left,normal uterus.  HGB was 9.7 on 03/05/21. Fall risk is low  Upstream - 04/01/21 1406       Pregnancy Intention Screening   Does the patient want to become pregnant in the next year? No    Does the patient's partner want to become pregnant in the next year? No    Would the patient like to discuss contraceptive options today? No      Contraception Wrap Up   Current Method Female Sterilization    End Method Female Sterilization    Contraception Counseling Provided No                Assessment:     1. Pregnancy examination or test, negative result   2. Menorrhagia with irregular cycle Will follow for now, but did discuss, IUD,ablation and OCs has options if returns   3. Iron deficiency anemia due to chronic blood loss Gave chromagen to start today I daily for 12 days  and then iron tablets  Meds ordered this encounter  Medications   Ferrous Sulfate (IRON) 90 (18 Fe) MG TABS    Sig: Take 1 daily    Dispense:  30 tablet    Refill:  0    Order Specific Question:   Supervising Provider    Answer:   06/01/21 [[id number]]   Iron Combinations (CHROMAGEN) capsule    Sig: Take 1 capsule by mouth daily.    Dispense:  12 capsule    Refill:  0    Order Specific Question:   Supervising Provider    Answer:   Lazaro Arms [[id number]]       Plan:     Follow up in 2 weeks will recheck HGB then and ROS

## 2021-04-10 ENCOUNTER — Ambulatory Visit: Payer: Medicaid Other | Admitting: Orthopaedic Surgery

## 2021-04-16 ENCOUNTER — Ambulatory Visit: Payer: Medicaid Other | Admitting: Adult Health

## 2022-05-17 IMAGING — MG MM DIGITAL SCREENING BILAT W/ TOMO AND CAD
8 series · 8 of 24 positions shown · non-contrast
Comparison: None.

CLINICAL DATA: Screening.

EXAM:
DIGITAL SCREENING BILATERAL MAMMOGRAM WITH TOMOSYNTHESIS AND CAD
TECHNIQUE: Bilateral screening digital craniocaudal and mediolateral oblique
mammograms were obtained. Bilateral screening digital breast
tomosynthesis was performed. The images were evaluated with
computer-aided detection.

[L MLO synth-2D]
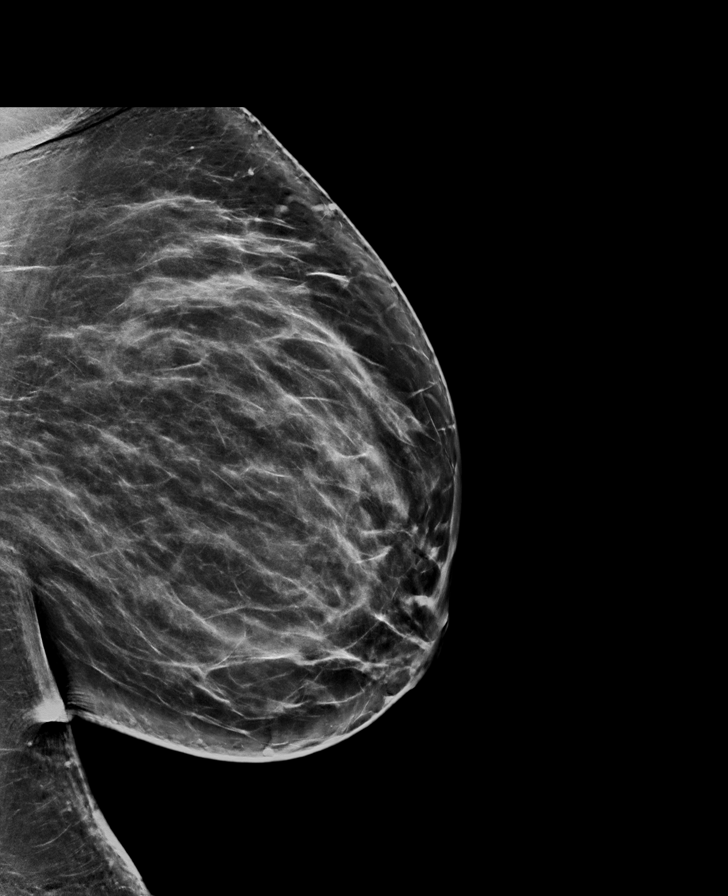

[R CC synth-2D]
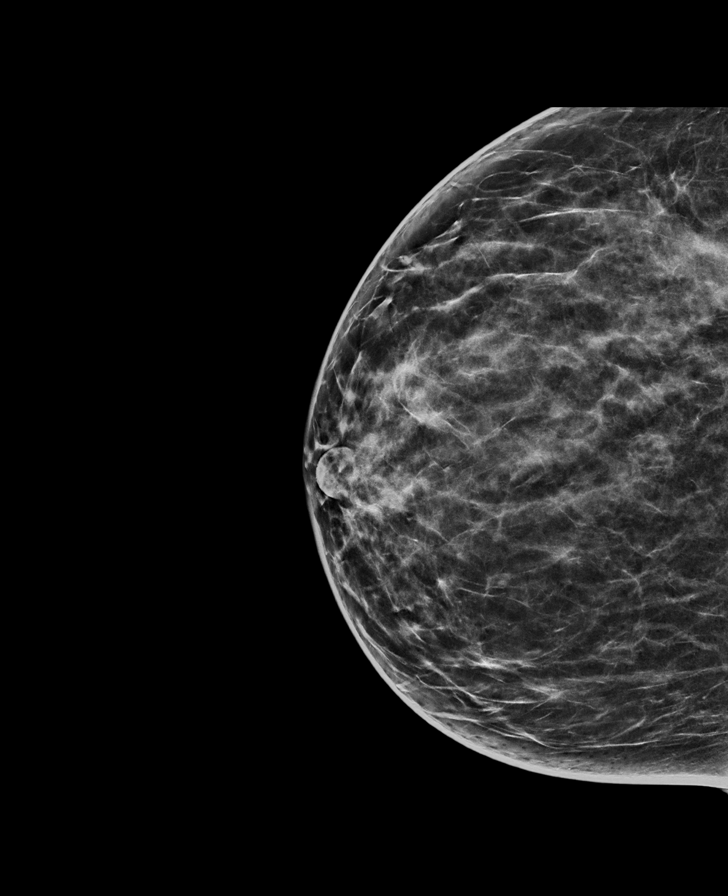

[L CC synth-2D]
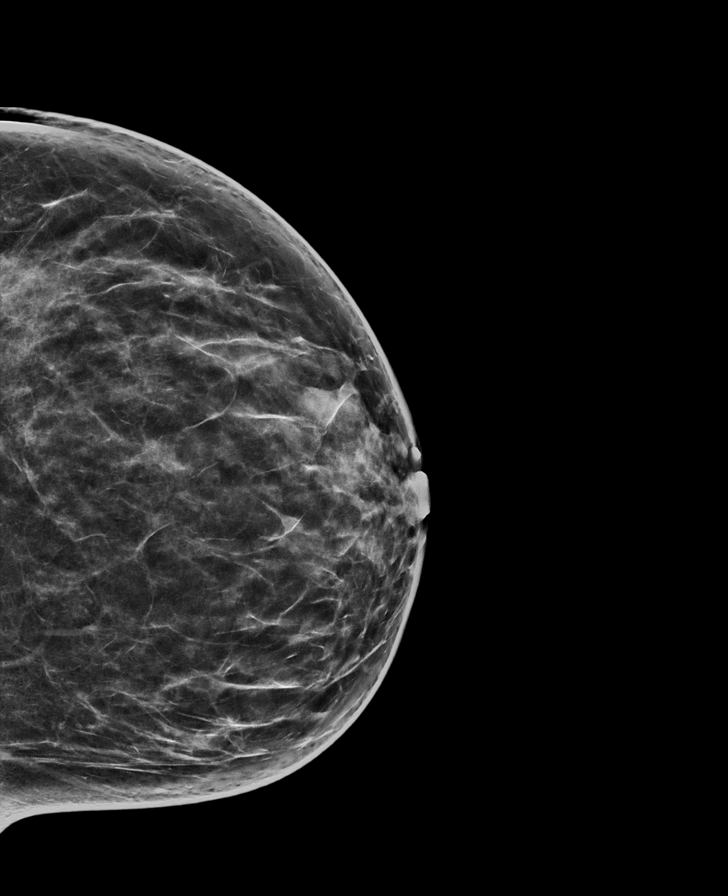

[R MLO synth-2D]
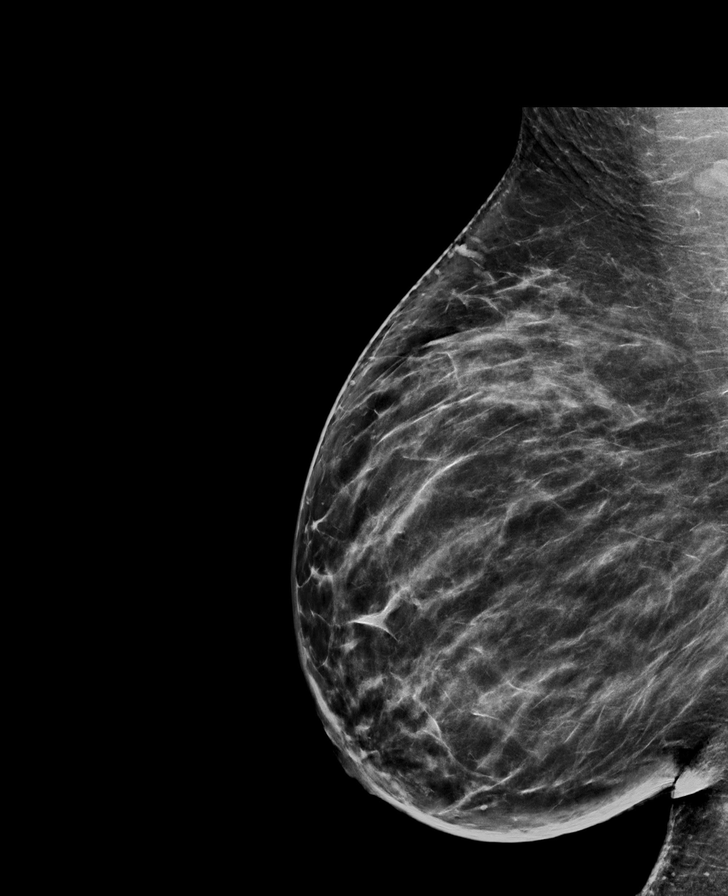

[R MLO tomo · tomo slice 47/93.0]
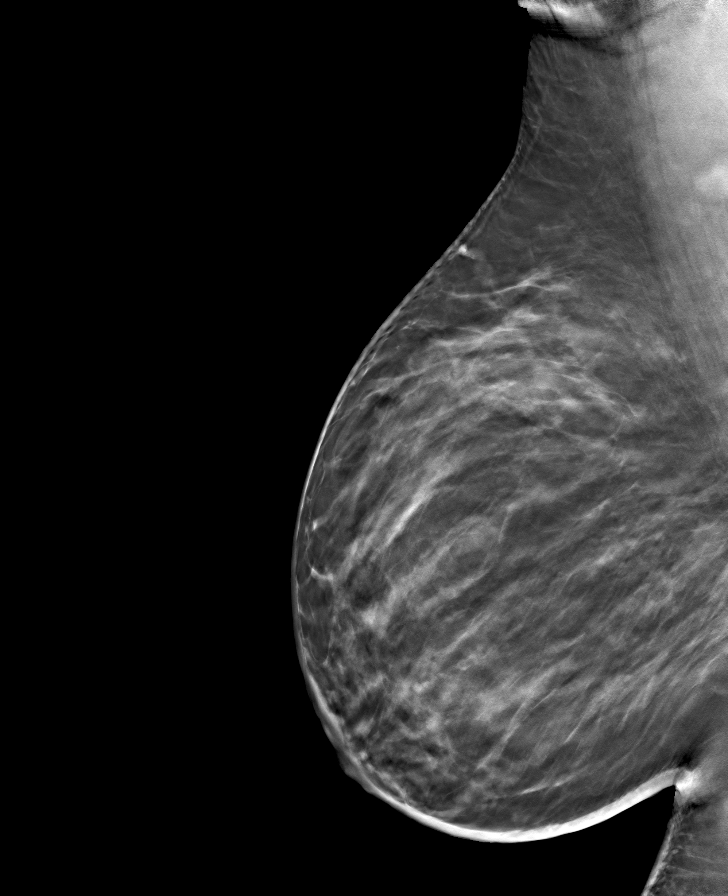

[L CC tomo · tomo slice 39/76.0]
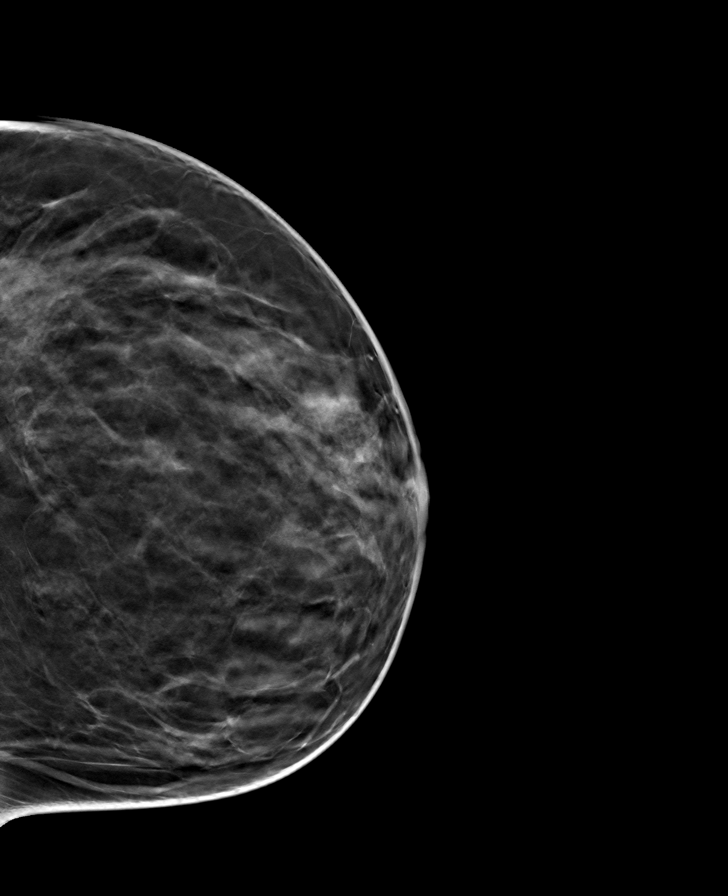

[L MLO tomo · tomo slice 46/91.0]
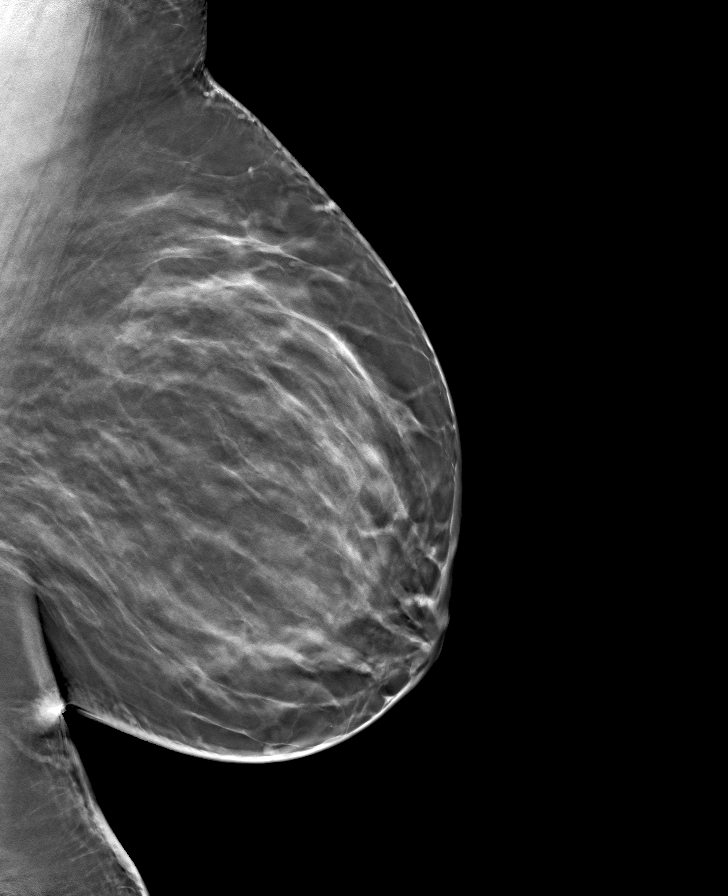

[R CC tomo · tomo slice 37/74.0]
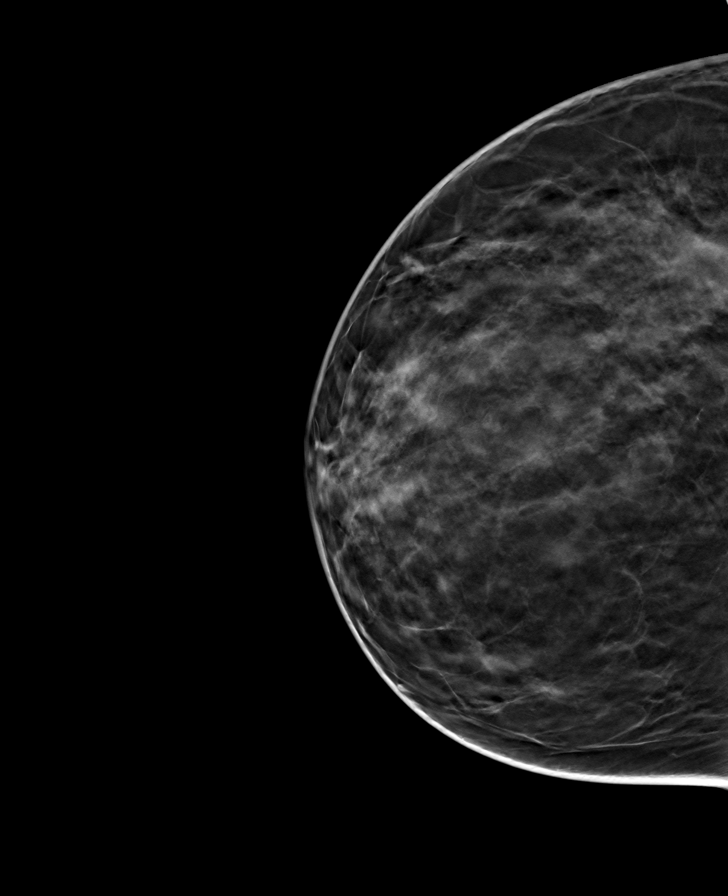

[8 of 24 positions shown; findings below may reference images not displayed]

ACR Breast Density Category c: The breast tissue is heterogeneously
dense, which may obscure small masses
FINDINGS: There are no findings suspicious for malignancy.
IMPRESSION: No mammographic evidence of malignancy. A result letter of this
screening mammogram will be mailed directly to the patient.

RECOMMENDATION:
Screening mammogram in one year. (Code:C8-T-HNK)

BI-RADS CATEGORY  1: Negative.

## 2022-10-22 ENCOUNTER — Encounter: Payer: Self-pay | Admitting: Radiology

## 2024-06-22 ENCOUNTER — Other Ambulatory Visit (HOSPITAL_COMMUNITY)
Admission: RE | Admit: 2024-06-22 | Discharge: 2024-06-22 | Disposition: A | Discharge status: Home / Self Care | Source: Ambulatory Visit | Attending: Adult Health | Admitting: Adult Health

## 2024-06-22 ENCOUNTER — Encounter: Payer: Self-pay | Admitting: Adult Health

## 2024-06-22 ENCOUNTER — Ambulatory Visit (INDEPENDENT_AMBULATORY_CARE_PROVIDER_SITE_OTHER): Admitting: Adult Health

## 2024-06-22 VITALS — BP 131/79 | HR 81 | Ht 62.0 in | Wt 213.5 lb

## 2024-06-22 DIAGNOSIS — Z1231 Encounter for screening mammogram for malignant neoplasm of breast: Secondary | ICD-10-CM

## 2024-06-22 DIAGNOSIS — Z1211 Encounter for screening for malignant neoplasm of colon: Secondary | ICD-10-CM | POA: Diagnosis not present

## 2024-06-22 DIAGNOSIS — Z13220 Encounter for screening for lipoid disorders: Secondary | ICD-10-CM

## 2024-06-22 DIAGNOSIS — Z1212 Encounter for screening for malignant neoplasm of rectum: Secondary | ICD-10-CM

## 2024-06-22 DIAGNOSIS — Z1331 Encounter for screening for depression: Secondary | ICD-10-CM | POA: Diagnosis not present

## 2024-06-22 DIAGNOSIS — Z0000 Encounter for general adult medical examination without abnormal findings: Secondary | ICD-10-CM | POA: Insufficient documentation

## 2024-06-22 DIAGNOSIS — Z7689 Persons encountering health services in other specified circumstances: Secondary | ICD-10-CM

## 2024-06-22 DIAGNOSIS — Z01419 Encounter for gynecological examination (general) (routine) without abnormal findings: Secondary | ICD-10-CM

## 2024-06-22 DIAGNOSIS — Z131 Encounter for screening for diabetes mellitus: Secondary | ICD-10-CM

## 2024-06-22 DIAGNOSIS — N921 Excessive and frequent menstruation with irregular cycle: Secondary | ICD-10-CM

## 2024-06-22 DIAGNOSIS — F17200 Nicotine dependence, unspecified, uncomplicated: Secondary | ICD-10-CM

## 2024-06-22 DIAGNOSIS — Z1329 Encounter for screening for other suspected endocrine disorder: Secondary | ICD-10-CM

## 2024-06-22 NOTE — Progress Notes (Signed)
 Patient ID: Jocelyn Erickson, female   DOB: 1981/05/15, 43 y.o.   MRN: [id number] History of Present Illness: Jocelyn Erickson is a 43 year old black female,married, G4P4 in for a well woman gyn exam and pap. Last seen in 2022, no PCP.     Current Medications, Allergies, Past Medical History, Past Surgical History, Family History and Social History were reviewed in [employer] record.     Review of Systems:  Patient denies any headaches, hearing loss, fatigue, blurred vision, shortness of breath, chest pain, abdominal pain, problems with bowel movements, urination, or intercourse. No joint pain or mood swings.  She is having some hot flashes and may skip a period and then period will be heavy, her last period lasted 7 days with 4 heavy days and changed pads every 20-30 minutes. But most of the time not that heavy.   Physical Exam:BP 131/79 (BP Location: Right Arm, Patient Position: Sitting, Cuff Size: Large)   Pulse 81   Ht 5' 2 (1.575 m)   Wt 213 lb 8 oz (96.8 kg)   LMP 05/08/2024 (Approximate)   BMI 39.05 kg/m   General:  Well developed, well nourished, no acute distress Skin:  Warm and dry Neck:  Midline trachea, normal thyroid, good ROM, no lymphadenopathy Lungs; Clear to auscultation bilaterally Breast:  No dominant palpable mass, retraction, or nipple discharge Cardiovascular: Regular rate and rhythm Abdomen:  Soft, non tender, no hepatosplenomegaly Pelvic:  External genitalia is normal in appearance, no lesions.  The vagina is normal in appearance. Urethra has no lesions or masses. The cervix is smooth, pap with HR HPV genotyping performed.  Uterus is felt to be normal size, shape, and contour.  No adnexal masses or tenderness noted.Bladder is non tender, no masses felt. Rectal: Deferred Extremities/musculoskeletal:  No swelling or varicosities noted, no clubbing or cyanosis Psych:  No mood changes, alert and cooperative,seems happy AA is 1 Fall risk is low     06/22/2024    2:35 PM 03/05/2021    1:32 PM 10/27/2017   10:17 AM  Depression screen PHQ 2/9  Decreased Interest 2 3 3   Down, Depressed, Hopeless 2 3 2   PHQ - 2 Score 4 6 5   Altered sleeping 2 3 2   Tired, decreased energy 2 3 3   Change in appetite 2 3 0  Feeling bad or failure about yourself  2 1 0  Trouble concentrating 2 2 3   Moving slowly or fidgety/restless 0 1 3  Suicidal thoughts 0 0 0  PHQ-9 Score 14 19 16    She declines meds, denies SI or HI    06/22/2024    2:35 PM 03/05/2021    1:34 PM  GAD 7 : Generalized Anxiety Score  Nervous, Anxious, on Edge 2 3  Control/stop worrying 2 3  Worry too much - different things 2 3  Trouble relaxing 2 3  Restless 1 3  Easily annoyed or irritable 3 3  Afraid - awful might happen 3 3  Total GAD 7 Score 15 21    Upstream - 06/22/24 1431       Pregnancy Intention Screening   Does the patient want to become pregnant in the next year? No    Does the patient's partner want to become pregnant in the next year? No    Would the patient like to discuss contraceptive options today? No      Contraception Wrap Up   Current Method Female Sterilization    End Method Female Sterilization  Contraception Counseling Provided No         Examination chaperoned by Jocelyn Salt LPN   Impression and plan: 1. Routine general medical examination at a health care facility (Primary) Pap sent Pap in 3 years if normal Physical in 1 year Will check labs, no PCP - Cytology - PAP( [place]) - CBC - Comprehensive metabolic panel with GFR - Lipid panel  2. Screening mammogram for breast cancer Mammogram scheduled for her for 06/28/24 at 7 am at [facility] - MM 3D SCREENING MAMMOGRAM BILATERAL BREAST; Future  3. Menorrhagia with irregular cycle Will skip periods and then period may be heavy, last period lasted 7 days with 4 heavy days, changed pads every 20-30 minutes, had some clots  Has some hot flashes too  4. Screening for colorectal  cancer No family history of colon cancer will order cologuard  - Cologuard  5. Screening cholesterol level - Lipid panel  6. Screening for diabetes mellitus - Hemoglobin A1c  7. Screening for thyroid disorder - TSH  8. Encounter to establish care Referred to Jocelyn Gerlach NP for PCP - Ambulatory Referral to Primary Care  9. Smoker Try to start decreasing

## 2024-06-23 ENCOUNTER — Ambulatory Visit: Payer: Self-pay | Admitting: Adult Health

## 2024-06-23 DIAGNOSIS — R87810 Cervical high risk human papillomavirus (HPV) DNA test positive: Secondary | ICD-10-CM

## 2024-06-23 LAB — LIPID PANEL
Chol/HDL Ratio: 5.1 ratio — ABNORMAL HIGH (ref 0.0–4.4)
Cholesterol, Total: 133 mg/dL (ref 100–199)
HDL: 26 mg/dL — ABNORMAL LOW (ref >39)
LDL Chol Calc (NIH): 88 mg/dL (ref 0–99)
Triglycerides: 98 mg/dL (ref 0–149)
VLDL Cholesterol Cal: 19 mg/dL (ref 5–40)

## 2024-06-23 LAB — HEMOGLOBIN A1C
Est. average glucose Bld gHb Est-mCnc: 120 mg/dL
Hgb A1c MFr Bld: 5.8 % — ABNORMAL HIGH (ref 4.8–5.6)

## 2024-06-23 LAB — COMPREHENSIVE METABOLIC PANEL WITH GFR
ALT: 13 IU/L (ref 0–32)
AST: 16 IU/L (ref 0–40)
Albumin: 4.5 g/dL (ref 3.9–4.9)
Alkaline Phosphatase: 90 IU/L (ref 41–116)
BUN/Creatinine Ratio: 11 (ref 9–23)
BUN: 9 mg/dL (ref 6–24)
Bilirubin Total: 0.3 mg/dL (ref 0.0–1.2)
CO2: 20 mmol/L (ref 20–29)
Calcium: 9.5 mg/dL (ref 8.7–10.2)
Chloride: 106 mmol/L (ref 96–106)
Creatinine, Ser: 0.8 mg/dL (ref 0.57–1.00)
Globulin, Total: 2.8 g/dL (ref 1.5–4.5)
Glucose: 78 mg/dL (ref 70–99)
Potassium: 4.1 mmol/L (ref 3.5–5.2)
Sodium: 141 mmol/L (ref 134–144)
Total Protein: 7.3 g/dL (ref 6.0–8.5)
eGFR: 93 mL/min/1.73 (ref >59)

## 2024-06-23 LAB — CBC
Hematocrit: 35.5 % (ref 34.0–46.6)
Hemoglobin: 10.4 g/dL — ABNORMAL LOW (ref 11.1–15.9)
MCH: 22 pg — ABNORMAL LOW (ref 26.6–33.0)
MCHC: 29.3 g/dL — ABNORMAL LOW (ref 31.5–35.7)
MCV: 75 fL — ABNORMAL LOW (ref 79–97)
Platelets: 350 x10E3/uL (ref 150–450)
RBC: 4.72 x10E6/uL (ref 3.77–5.28)
RDW: 14.6 % (ref 11.7–15.4)
WBC: 10.8 x10E3/uL (ref 3.4–10.8)

## 2024-06-23 LAB — TSH: TSH: 1.87 u[IU]/mL (ref 0.450–4.500)

## 2024-06-27 LAB — CYTOLOGY - PAP
Comment: NEGATIVE
Comment: NEGATIVE
Comment: NEGATIVE
Diagnosis: NEGATIVE
HPV 16: NEGATIVE
HPV 18 / 45: NEGATIVE
High risk HPV: POSITIVE — AB

## 2024-06-28 ENCOUNTER — Ambulatory Visit (HOSPITAL_COMMUNITY)
Admission: RE | Admit: 2024-06-28 | Discharge: 2024-06-28 | Disposition: A | Discharge status: Home / Self Care | Source: Ambulatory Visit | Attending: Adult Health | Admitting: Adult Health

## 2024-06-28 DIAGNOSIS — R87810 Cervical high risk human papillomavirus (HPV) DNA test positive: Secondary | ICD-10-CM | POA: Insufficient documentation

## 2024-06-28 DIAGNOSIS — Z1231 Encounter for screening mammogram for malignant neoplasm of breast: Secondary | ICD-10-CM | POA: Insufficient documentation
